# Patient Record
Sex: Male | Born: 1960 | Race: White | Hispanic: No | Marital: Married | Smoking: Current every day smoker
Health system: Southern US, Community
[De-identification: ages and names within clinical notes are randomized; demographics above are authoritative.]

## PROBLEM LIST (undated history)

## (undated) DIAGNOSIS — G8929 Other chronic pain: Secondary | ICD-10-CM

## (undated) DIAGNOSIS — M549 Dorsalgia, unspecified: Secondary | ICD-10-CM

## (undated) DIAGNOSIS — M545 Low back pain, unspecified: Secondary | ICD-10-CM

## (undated) DIAGNOSIS — Z720 Tobacco use: Secondary | ICD-10-CM

## (undated) HISTORY — DX: Other chronic pain: G89.29

## (undated) HISTORY — DX: Low back pain, unspecified: M54.50

## (undated) HISTORY — DX: Tobacco use: Z72.0

## (undated) HISTORY — PX: HERNIA REPAIR: SHX51

---

## 1992-02-04 HISTORY — PX: BACK SURGERY: SHX140

## 2014-03-22 ENCOUNTER — Encounter (HOSPITAL_COMMUNITY): Payer: Self-pay | Admitting: *Deleted

## 2014-03-22 ENCOUNTER — Emergency Department (HOSPITAL_COMMUNITY)
Admission: EM | Admit: 2014-03-22 | Discharge: 2014-03-22 | Discharge status: Against Medical Advice | Payer: 59 | Attending: Emergency Medicine | Admitting: Emergency Medicine

## 2014-03-22 DIAGNOSIS — R109 Unspecified abdominal pain: Secondary | ICD-10-CM | POA: Insufficient documentation

## 2014-03-22 DIAGNOSIS — G8929 Other chronic pain: Secondary | ICD-10-CM | POA: Diagnosis not present

## 2014-03-22 DIAGNOSIS — R1110 Vomiting, unspecified: Secondary | ICD-10-CM | POA: Diagnosis not present

## 2014-03-22 DIAGNOSIS — Z720 Tobacco use: Secondary | ICD-10-CM | POA: Insufficient documentation

## 2014-03-22 HISTORY — DX: Other chronic pain: G89.29

## 2014-03-22 HISTORY — DX: Dorsalgia, unspecified: M54.9

## 2014-03-22 LAB — COMPREHENSIVE METABOLIC PANEL
ALBUMIN: 4.2 g/dL (ref 3.5–5.2)
ALK PHOS: 60 U/L (ref 39–117)
ALT: 23 U/L (ref 0–53)
ANION GAP: 7 (ref 5–15)
AST: 19 U/L (ref 0–37)
BUN: 8 mg/dL (ref 6–23)
CHLORIDE: 106 mmol/L (ref 96–112)
CO2: 27 mmol/L (ref 19–32)
CREATININE: 0.77 mg/dL (ref 0.50–1.35)
Calcium: 9.4 mg/dL (ref 8.4–10.5)
GFR calc Af Amer: >90 mL/min (ref >90)
GLUCOSE: 100 mg/dL — AB (ref 70–99)
Potassium: 4.1 mmol/L (ref 3.5–5.1)
Sodium: 140 mmol/L (ref 135–145)
Total Bilirubin: 0.9 mg/dL (ref 0.3–1.2)
Total Protein: 6.9 g/dL (ref 6.0–8.3)

## 2014-03-22 LAB — CBC WITH DIFFERENTIAL/PLATELET
BASOS ABS: 0.1 10*3/uL (ref 0.0–0.1)
Basophils Relative: 1 % (ref 0–1)
EOS ABS: 0.2 10*3/uL (ref 0.0–0.7)
Eosinophils Relative: 4 % (ref 0–5)
HCT: 43.8 % (ref 39.0–52.0)
Hemoglobin: 14.9 g/dL (ref 13.0–17.0)
LYMPHS ABS: 1.7 10*3/uL (ref 0.7–4.0)
LYMPHS PCT: 27 % (ref 12–46)
MCH: 31 pg (ref 26.0–34.0)
MCHC: 34 g/dL (ref 30.0–36.0)
MCV: 91.3 fL (ref 78.0–100.0)
MONO ABS: 0.5 10*3/uL (ref 0.1–1.0)
Monocytes Relative: 8 % (ref 3–12)
Neutro Abs: 3.8 10*3/uL (ref 1.7–7.7)
Neutrophils Relative %: 60 % (ref 43–77)
PLATELETS: 302 10*3/uL (ref 150–400)
RBC: 4.8 MIL/uL (ref 4.22–5.81)
RDW: 13 % (ref 11.5–15.5)
WBC: 6.2 10*3/uL (ref 4.0–10.5)

## 2014-03-22 LAB — LIPASE, BLOOD: LIPASE: 21 U/L (ref 11–59)

## 2014-03-22 NOTE — ED Notes (Signed)
Pt did not answer x 1 

## 2014-03-22 NOTE — ED Notes (Signed)
Pt did not answer x 2 

## 2014-03-22 NOTE — ED Notes (Addendum)
Pt reports having abd discomfort and n/v x 3 weeks, went to [facility] and was told possibly gallbladder and given zofran and referrred to surgeon. Pt having increase in abd pain last night.

## 2016-08-15 ENCOUNTER — Encounter (HOSPITAL_BASED_OUTPATIENT_CLINIC_OR_DEPARTMENT_OTHER): Payer: Self-pay | Admitting: *Deleted

## 2016-08-15 ENCOUNTER — Emergency Department (HOSPITAL_BASED_OUTPATIENT_CLINIC_OR_DEPARTMENT_OTHER): Payer: Worker's Compensation

## 2016-08-15 ENCOUNTER — Emergency Department (HOSPITAL_BASED_OUTPATIENT_CLINIC_OR_DEPARTMENT_OTHER)
Admission: EM | Admit: 2016-08-15 | Discharge: 2016-08-15 | Disposition: A | Discharge status: Home / Self Care | Payer: Worker's Compensation | Attending: Physician Assistant | Admitting: Physician Assistant

## 2016-08-15 DIAGNOSIS — W19XXXA Unspecified fall, initial encounter: Secondary | ICD-10-CM | POA: Insufficient documentation

## 2016-08-15 DIAGNOSIS — F17210 Nicotine dependence, cigarettes, uncomplicated: Secondary | ICD-10-CM | POA: Insufficient documentation

## 2016-08-15 DIAGNOSIS — Z23 Encounter for immunization: Secondary | ICD-10-CM | POA: Diagnosis not present

## 2016-08-15 DIAGNOSIS — S59901A Unspecified injury of right elbow, initial encounter: Secondary | ICD-10-CM | POA: Diagnosis present

## 2016-08-15 DIAGNOSIS — S51011A Laceration without foreign body of right elbow, initial encounter: Secondary | ICD-10-CM | POA: Diagnosis not present

## 2016-08-15 DIAGNOSIS — Y990 Civilian activity done for income or pay: Secondary | ICD-10-CM | POA: Diagnosis not present

## 2016-08-15 DIAGNOSIS — S51019A Laceration without foreign body of unspecified elbow, initial encounter: Secondary | ICD-10-CM

## 2016-08-15 DIAGNOSIS — Y939 Activity, unspecified: Secondary | ICD-10-CM | POA: Insufficient documentation

## 2016-08-15 DIAGNOSIS — Y929 Unspecified place or not applicable: Secondary | ICD-10-CM | POA: Diagnosis not present

## 2016-08-15 IMAGING — DX DG ELBOW COMPLETE 3+V*R*
4 series · 4 of 4 positions shown · non-contrast
Comparison: None.

CLINICAL DATA: Slipped and fell yesterday. Right elbow pain and
swelling.

EXAM:
RIGHT ELBOW - COMPLETE 3+ VIEW

[elbow ap]
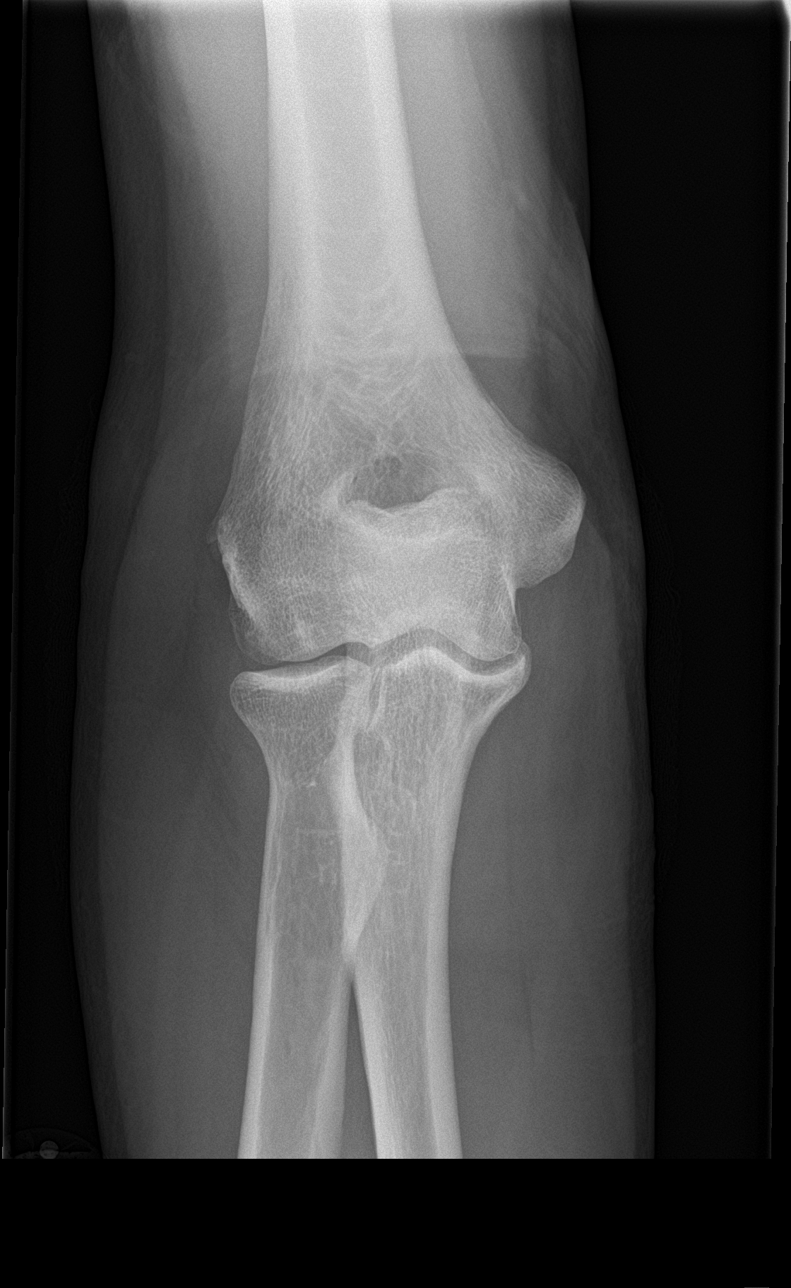

[elbow obl (1 of 2)]
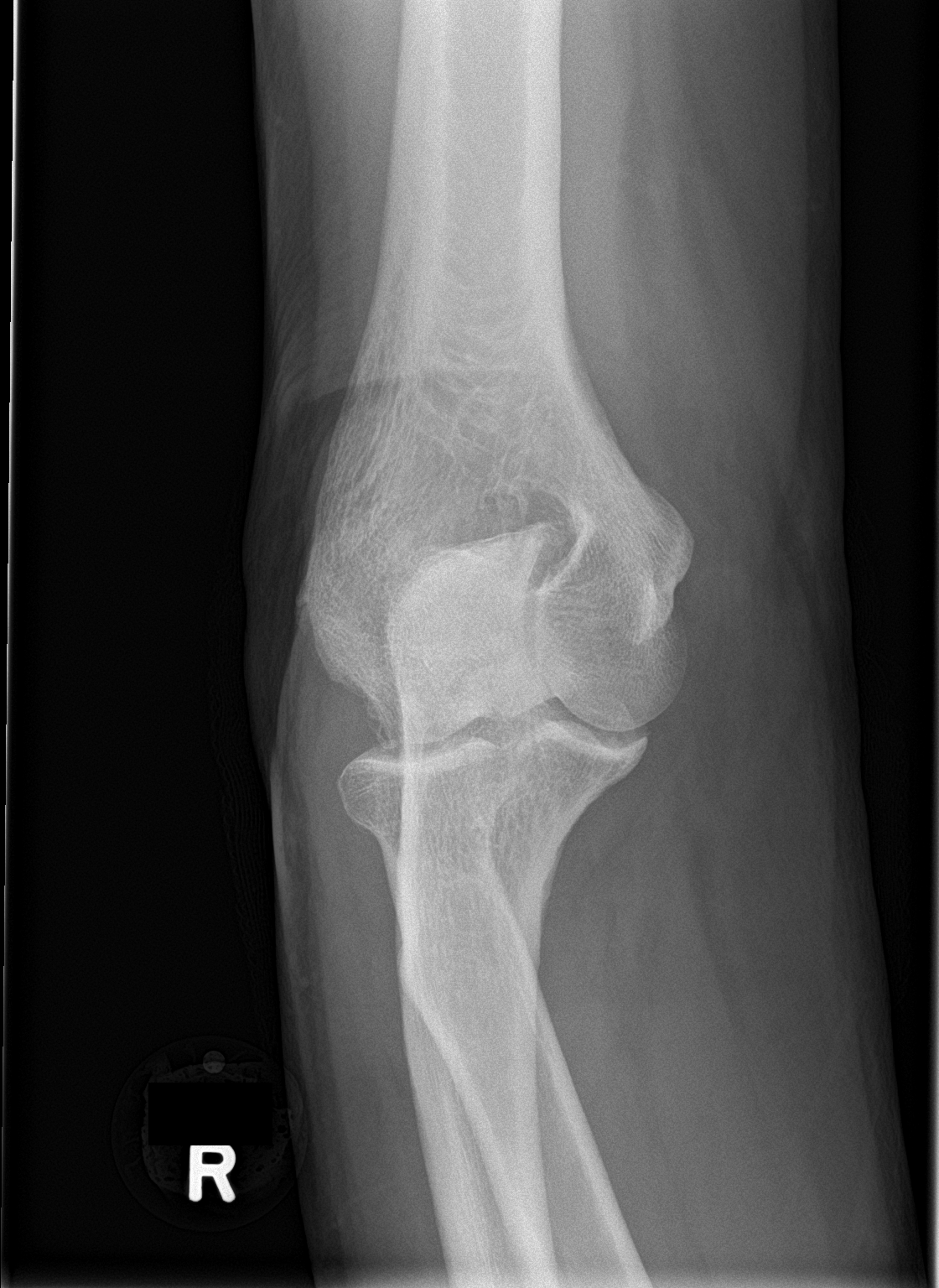

[elbow obl (2 of 2)]
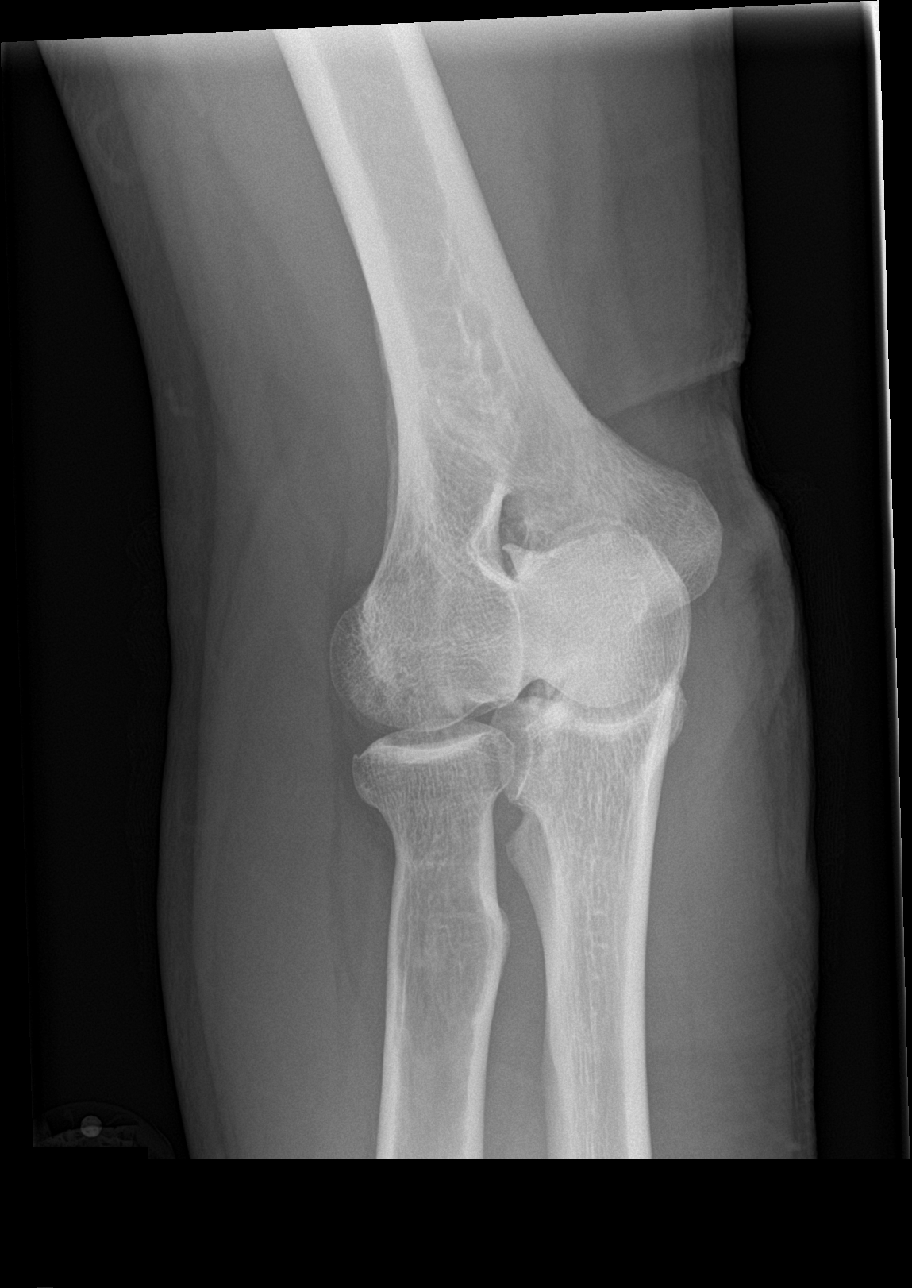

[elbow lat]
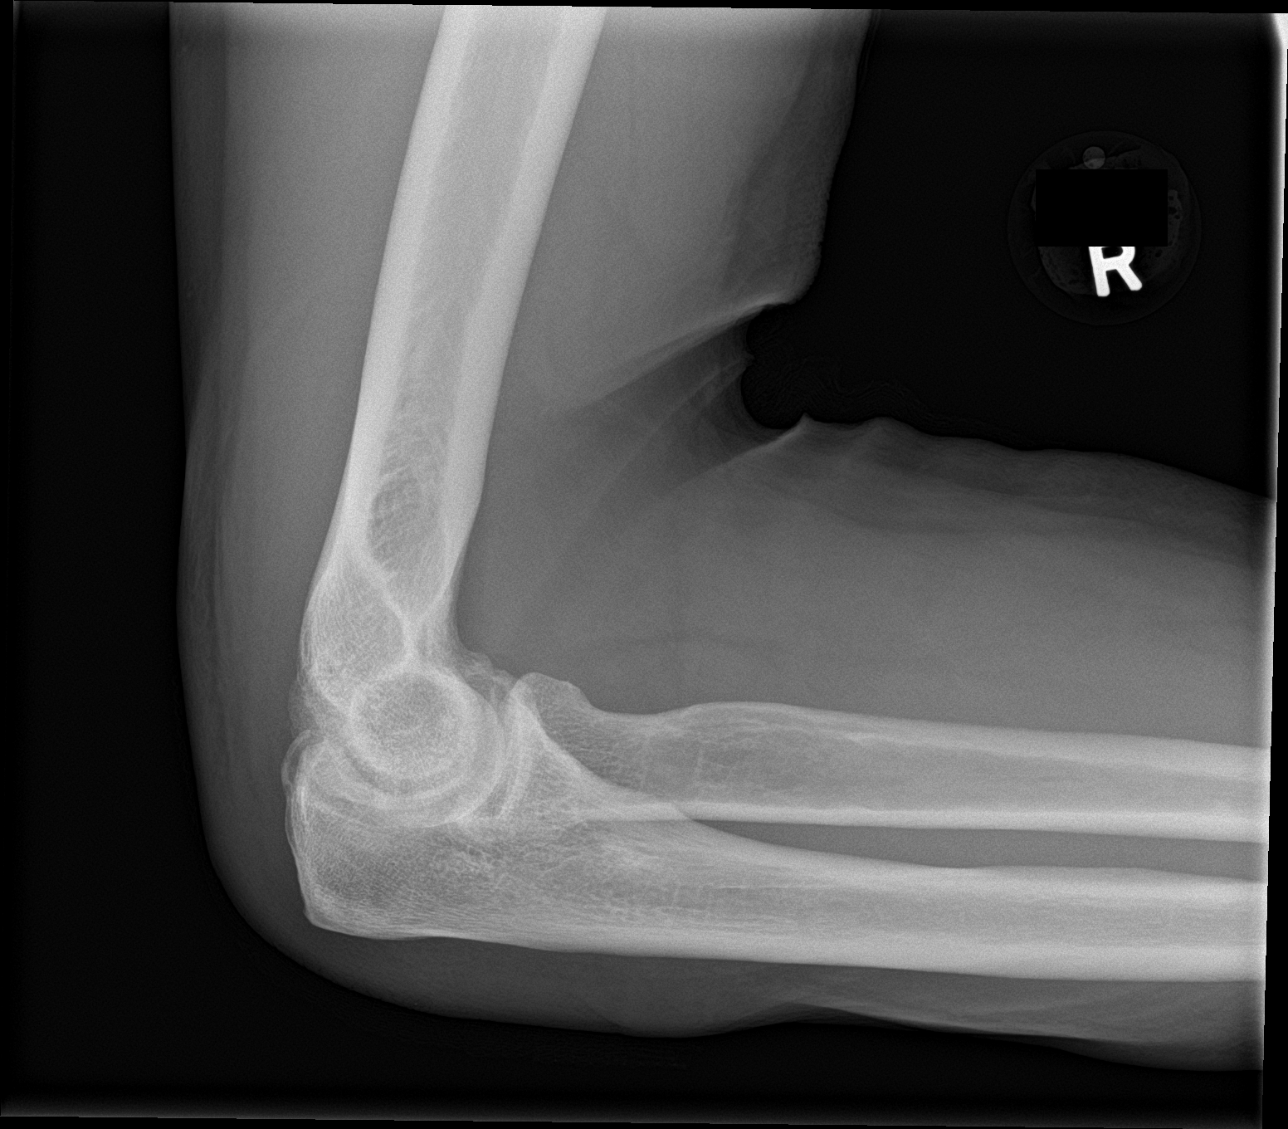

[4 of 4 positions shown; findings below may reference images not displayed]

FINDINGS: Extensive soft tissue swelling is present about the elbow. The elbow
is located. There is no significant joint effusion. No underlying
fracture is present.
IMPRESSION: Soft tissue swelling about the dorsal aspect of the elbow without
underlying fracture or dislocation.

## 2016-08-15 MED ORDER — LIDOCAINE HCL (PF) 1 % IJ SOLN
10.0000 mL | Freq: Once | INTRAMUSCULAR | Status: AC
  Administered 2016-08-15: 10 mL via INTRADERMAL
  Filled 2016-08-15: qty 10

## 2016-08-15 MED ORDER — CEFAZOLIN SODIUM-DEXTROSE 2-4 GM/100ML-% IV SOLN
2.0000 g | Freq: Once | INTRAVENOUS | Status: DC
Start: 2016-08-15 — End: 2016-08-15
  Filled 2016-08-15: qty 100

## 2016-08-15 MED ORDER — CEFAZOLIN SODIUM 1 G IJ SOLR
INTRAMUSCULAR | Status: AC
  Administered 2016-08-15: 2000 mg
  Filled 2016-08-15: qty 20

## 2016-08-15 MED ORDER — CEPHALEXIN 500 MG PO CAPS: ORAL_CAPSULE | ORAL | 0 refills | Status: DC

## 2016-08-15 MED ORDER — TETANUS-DIPHTH-ACELL PERTUSSIS 5-2.5-18.5 LF-MCG/0.5 IM SUSP
0.5000 mL | Freq: Once | INTRAMUSCULAR | Status: AC
  Administered 2016-08-15: 0.5 mL via INTRAMUSCULAR
  Filled 2016-08-15: qty 0.5

## 2016-08-15 NOTE — ED Notes (Signed)
Unsuccessful IV attempt x1 -- blood return noted, infiltrated with saline flush.

## 2016-08-15 NOTE — ED Triage Notes (Signed)
Right elbow laceration. He fell at work. Needs UDS for workmans comp.

## 2016-08-15 NOTE — ED Notes (Signed)
Post accident UDS completed.

## 2016-08-15 NOTE — ED Provider Notes (Signed)
[facility] Provider Note   CSN: [id number] Arrival date & time: 08/15/16  1652  By signing my name below, I, Theresia Bough, attest that this documentation has been prepared under the direction and in the presence of Margarita Mail, PA-C.  Electronically Signed: Theresia Bough, ED Scribe. 08/15/16. 6:08 PM.  History   Chief Complaint Chief Complaint  Patient presents with  . Extremity Laceration   The history is provided by the patient. No language interpreter was used.   HPI Comments: Jonathon Dixon is a 56 y.o. male who presents to the Emergency Department complaining of sudden onset laceration to the right elbow onset yesterday afternoon. Pt states he was at work and was hit with duct work causing him to fall and land on his right arm. Pt reports pain to the area. Tetanus is not UTD. No other complaints at this time.  Past Medical History:  Diagnosis Date  . Chronic back pain     There are no active problems to display for this patient.   History reviewed. No pertinent surgical history.     Home Medications    Prior to Admission medications   Medication Sig Start Date End Date Taking? Authorizing Provider  OXYCODONE HCL PO Take by mouth.   Yes [provider]    Family History No family history on file.  Social History Social History  Substance Use Topics  . Smoking status: Current Every Day Smoker    Types: Cigarettes  . Smokeless tobacco: Never Used  . Alcohol use No     Allergies   Patient has no known allergies.   Review of Systems Review of Systems All other systems reviewed and are negative for acute change except as noted in the HPI.  Physical Exam Updated Vital Signs BP 112/76   Pulse 68   Temp 98.3 F (36.8 C) (Oral)   Resp 20   Ht 6\' 2"  (1.88 m)   Wt 220 lb (99.8 kg)   SpO2 97%   BMI 28.25 kg/m   Physical Exam  Constitutional: He is oriented to person, place, and time. He appears well-developed and  well-nourished.  HENT:  Head: Normocephalic and atraumatic.  Eyes: EOM are normal.  Neck: Normal range of motion.  Cardiovascular: Normal rate, regular rhythm, normal heart sounds and intact distal pulses.   Pulmonary/Chest: Effort normal and breath sounds normal. No respiratory distress.  Abdominal: Soft. He exhibits no distension. There is no tenderness.  Musculoskeletal: Normal range of motion. He exhibits tenderness. He exhibits no edema or deformity.  Neurological: He is alert and oriented to person, place, and time.  Skin: Skin is warm and dry.  3 cm laceration over the distal forearm on the ulnar surface. Normal hand,wrist and shoulder exam on the right. No bony tenderness on the condyles. No bont tenderness on the olecranon. Tenderness over the laceration. Normal ROM and strength. Wound appears to be down to the joint capsule, bleeding is controlled.   Psychiatric: He has a normal mood and affect. Judgment normal.  Nursing note and vitals reviewed.    ED Treatments / Results  DIAGNOSTIC STUDIES: Oxygen Saturation is 97% on RA, normal by my interpretation.   COORDINATION OF CARE: 6:08 PM-Discussed next steps with pt including tetanus booster. Pt verbalized understanding and is agreeable with the plan.   Labs (all labs ordered are listed, but only abnormal results are displayed) Labs Reviewed - No data to display  EKG  EKG Interpretation None  Radiology Dg Elbow Complete Right  Result Date: 08/15/2016 CLINICAL DATA:  Slipped and fell yesterday. Right elbow pain and swelling. EXAM: RIGHT ELBOW - COMPLETE 3+ VIEW COMPARISON:  None. FINDINGS: Extensive soft tissue swelling is present about the elbow. The elbow is located. There is no significant joint effusion. No underlying fracture is present. IMPRESSION: Soft tissue swelling about the dorsal aspect of the elbow without underlying fracture or dislocation. Electronically Signed   By: San Morelle M.D.   On:  08/15/2016 17:30    Procedures Procedures (including critical care time)  Medications Ordered in ED Medications  lidocaine (PF) (XYLOCAINE) 1 % injection 10 mL (10 mLs Intradermal Given 08/15/16 1727)  Tdap (BOOSTRIX) injection 0.5 mL (0.5 mLs Intramuscular Given 08/15/16 1726)     Initial Impression / Assessment and Plan / ED Course  I have reviewed the triage vital signs and the nursing notes.  Pertinent labs & imaging results that were available during my care of the patient were reviewed by me and considered in my medical decision making (see chart for details).     Patient with tissue avulsion and laceration. Unable to close to 2 length of time since the initial injury. Patient treated with IV Ancef, seen in shared visit with attending physician. He was irrigated thoroughly in the emergency department. He appears safe for discharge at this time. No evidence of joint infection, full range of motion of the elbow. Follow-up with ortho  Final Clinical Impressions(s) / ED Diagnoses   Final diagnoses:  Laceration of elbow with complication, initial encounter    New Prescriptions New Prescriptions   No medications on file   I personally performed the services described in this documentation, which was scribed in my presence. The recorded information has been reviewed and is accurate.      Margarita Mail, PA-C 08/20/16 1533    Mackuen, Fredia Sorrow, MD 08/21/16 [phone]

## 2016-08-15 NOTE — Discharge Instructions (Signed)
Please take you antibiotics AS DIRECTED! Make sure to keep your sound covered and clean at all times.Gently cleanse the wound 2x daily  and change your dressing. You may apply antibiotic ointment.  Follow up with the orthopedist for any concerns Return to the Emergency department for any Worsening symptoms in the joint, fever, chills, or flu like symptoms, worsening redness, heat, swelling, or discharge from the wound.

## 2016-08-15 NOTE — ED Notes (Signed)
ED Provider at bedside. 

## 2017-02-13 ENCOUNTER — Emergency Department (HOSPITAL_BASED_OUTPATIENT_CLINIC_OR_DEPARTMENT_OTHER): Payer: 59

## 2017-02-13 ENCOUNTER — Emergency Department (HOSPITAL_BASED_OUTPATIENT_CLINIC_OR_DEPARTMENT_OTHER)
Admission: EM | Admit: 2017-02-13 | Discharge: 2017-02-13 | Disposition: A | Discharge status: Home / Self Care | Payer: 59 | Attending: Emergency Medicine | Admitting: Emergency Medicine

## 2017-02-13 ENCOUNTER — Other Ambulatory Visit: Payer: Self-pay

## 2017-02-13 ENCOUNTER — Encounter (HOSPITAL_BASED_OUTPATIENT_CLINIC_OR_DEPARTMENT_OTHER): Payer: Self-pay | Admitting: Emergency Medicine

## 2017-02-13 DIAGNOSIS — Y939 Activity, unspecified: Secondary | ICD-10-CM | POA: Diagnosis not present

## 2017-02-13 DIAGNOSIS — M7021 Olecranon bursitis, right elbow: Secondary | ICD-10-CM | POA: Diagnosis not present

## 2017-02-13 DIAGNOSIS — F17210 Nicotine dependence, cigarettes, uncomplicated: Secondary | ICD-10-CM | POA: Diagnosis not present

## 2017-02-13 DIAGNOSIS — R2231 Localized swelling, mass and lump, right upper limb: Secondary | ICD-10-CM | POA: Diagnosis present

## 2017-02-13 NOTE — ED Notes (Signed)
Family at bedside. 

## 2017-02-13 NOTE — ED Triage Notes (Signed)
Patient states that he fell and hurt his right elbow about 8 months ago. It was doing better until about 3 weeks ago and he started to get swelling to the area after another injury

## 2017-02-13 NOTE — ED Provider Notes (Signed)
[facility] EMERGENCY DEPARTMENT Provider Note   CSN: [id number] Arrival date & time: 02/13/17  1541     History   Chief Complaint Chief Complaint  Patient presents with  . Joint Swelling    HPI Jonathon Dixon is a 57 y.o. male.  HPI Patient presents with right elbow pain and swelling.  Had previous injury around 8 months ago and had been doing better but around 3 weeks ago began to have more swelling.  No fevers.  No real pain with movement.  States he is hoping that he can get drained and something injected.  No numbness weakness.  No new severe trauma. Past Medical History:  Diagnosis Date  . Chronic back pain     There are no active problems to display for this patient.   History reviewed. No pertinent surgical history.     Home Medications    Prior to Admission medications   Medication Sig Start Date End Date Taking? Authorizing Provider  cephALEXin (KEFLEX) 500 MG capsule 2 caps po bid x 7 days 08/15/16   Margarita Mail, PA-C  OXYCODONE HCL PO Take by mouth.    [provider]    Family History History reviewed. No pertinent family history.  Social History Social History   Tobacco Use  . Smoking status: Current Every Day Smoker    Types: Cigarettes  . Smokeless tobacco: Never Used  Substance Use Topics  . Alcohol use: No  . Drug use: No     Allergies   Patient has no known allergies.   Review of Systems Review of Systems  Constitutional: Negative for chills and fever.  Respiratory: Negative for shortness of breath.   Musculoskeletal: Negative for neck pain.       Right elbow swelling.  Skin: Negative for rash and wound.  Neurological: Negative for weakness and numbness.     Physical Exam Updated Vital Signs BP (!) 143/92 (BP Location: Right Arm)   Pulse 66   Temp 98.2 F (36.8 C) (Oral)   Resp 18   Ht 6\' 1"  (1.854 m)   Wt 99.8 kg (220 lb)   SpO2 100%   BMI 29.03 kg/m   Physical Exam  Constitutional: He  appears well-developed.  HENT:  Head: Normocephalic.  Cardiovascular: Normal rate.  Musculoskeletal: He exhibits no tenderness.  Olecranon bursitis right elbow.  Fluctuant swollen area over olecranon.  No erythema.  No tenderness.  Painless range of motion in the elbow.  Neurovascular intact in right wrist and hand.  Neurological: He is alert.  Skin: Skin is warm. Capillary refill takes less than 2 seconds.     ED Treatments / Results  Labs (all labs ordered are listed, but only abnormal results are displayed) Labs Reviewed - No data to display  EKG  EKG Interpretation None       Radiology Dg Elbow Complete Right  Result Date: 02/13/2017 CLINICAL DATA:  Trauma to the posterior right elbow 2 weeks ago with swelling. EXAM: RIGHT ELBOW - COMPLETE 3+ VIEW COMPARISON:  August 15, 2016 FINDINGS: There is no evidence of fracture, dislocation, or joint effusion. There is no evidence of arthropathy or other focal bone abnormality. There is marked soft tissue swelling of the posterior elbow. IMPRESSION: No acute fracture or dislocation. Marked soft tissue swelling of posterior elbow. Electronically Signed   By: Abelardo Diesel M.D.   On: 02/13/2017 16:30    Procedures Procedures (including critical care time)  Medications Ordered in ED Medications - No  data to display   Initial Impression / Assessment and Plan / ED Course  I have reviewed the triage vital signs and the nursing notes.  Pertinent labs & imaging results that were available during my care of the patient were reviewed by me and considered in my medical decision making (see chart for details).     Patient with apparent olecranon bursitis.  Has no signs of infection.  Given sports medicine name for follow-up for potential drainage and steroid injection.  Patient states he will just follow-up with his primary care doctor for cannot get done today.  Final Clinical Impressions(s) / ED Diagnoses   Final diagnoses:  Olecranon  bursitis of right elbow    ED Discharge Orders    None       Davonna Belling, MD 02/13/17 [phone]

## 2017-02-13 NOTE — ED Notes (Signed)
ED Provider at bedside. 

## 2019-11-28 ENCOUNTER — Encounter: Payer: Self-pay | Admitting: Plastic Surgery

## 2019-11-28 ENCOUNTER — Other Ambulatory Visit: Payer: Self-pay

## 2019-11-28 ENCOUNTER — Ambulatory Visit (INDEPENDENT_AMBULATORY_CARE_PROVIDER_SITE_OTHER): Payer: 59 | Admitting: Plastic Surgery

## 2019-11-28 VITALS — BP 133/80 | HR 88 | Temp 97.9°F | Ht 74.0 in | Wt 215.6 lb

## 2019-11-28 DIAGNOSIS — C4402 Squamous cell carcinoma of skin of lip: Secondary | ICD-10-CM | POA: Diagnosis not present

## 2019-11-28 NOTE — Progress Notes (Signed)
   Referring Provider Blythe Stanford., MD [address] [place],  [place] [address]   CC:  Chief Complaint  Patient presents with  . Advice Only      Jonathon Dixon is an 59 y.o. male.  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  This was biopsied externally but also extends onto the oromucosa inside the mouth.  He believes it is gotten smaller over the last couple weeks but is certainly not gone away.  He would like to have it removed.  No Known Allergies  Outpatient Encounter Medications as of 11/28/2019  Medication Sig  . OXYCODONE HCL PO Take 10 mg by mouth 4 (four) times daily.   . [DISCONTINUED] cephALEXin (KEFLEX) 500 MG capsule 2 caps po bid x 7 days   No facility-administered encounter medications on file as of 11/28/2019.     Past Medical History:  Diagnosis Date  . Chronic back pain     No past surgical history on file.  No family history on file.  Social History   Social History Narrative  . Not on file     Review of Systems General: Denies fevers, chills, weight loss CV: Denies chest pain, shortness of breath, palpitations  Physical Exam Vitals with BMI 11/28/2019 02/13/2017 02/13/2017  Height 6\' 2"  - -  Weight 215 lbs 10 oz - -  BMI 62.94 - -  Systolic [phone]  Diastolic 80 92 76  Pulse 88 66 67    General:  No acute distress,  Alert and oriented, Non-Toxic, Normal speech and affect Examination shows a 1 x 3 cm lesion that starts at the right oral commissure and extends onto the oromucosa across the vermilion.  There is no submandibular cervical lymphadenopathy.  I do not see any obvious other surrounding skin lesions.  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  I recommended excision.  I did discuss doing this in the operating room under frozen sections to ensure appropriate margin control.  We discussed the risk  that include bleeding, infection, damage to surrounding structures and need for additional procedures.  All of his questions were answered and we will plan to move forward with scheduling.  Cindra Presume 11/28/2019, 1:56 PM

## 2019-12-23 ENCOUNTER — Ambulatory Visit (INDEPENDENT_AMBULATORY_CARE_PROVIDER_SITE_OTHER): Payer: 59 | Admitting: Surgical

## 2019-12-23 ENCOUNTER — Encounter: Payer: Self-pay | Admitting: Surgical

## 2019-12-23 ENCOUNTER — Other Ambulatory Visit: Payer: Self-pay | Admitting: Surgical

## 2019-12-23 ENCOUNTER — Other Ambulatory Visit: Payer: Self-pay

## 2019-12-23 VITALS — BP 153/89 | HR 63 | Temp 97.5°F | Ht 73.0 in | Wt 218.8 lb

## 2019-12-23 DIAGNOSIS — C4402 Squamous cell carcinoma of skin of lip: Secondary | ICD-10-CM

## 2019-12-23 MED ORDER — ONDANSETRON HCL 4 MG PO TABS: 4.0000 mg | ORAL_TABLET | Freq: Three times a day (TID) | ORAL | 0 refills | Status: DC | PRN

## 2019-12-23 NOTE — Progress Notes (Signed)
Patient ID: Jonathon Dixon, male    DOB: [birth year], 59 y.o.   MRN: [id number]  Chief Complaint  Patient presents with  . Pre-op Exam      ICD-10-CM   1. Squamous cell carcinoma, lip  C44.02     History of Present Illness: Jonathon Dixon is a 59 y.o.  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 Claudia Desanctis from Mohs surgeon.  He presents for preoperative evaluation for upcoming procedure, excision of lower lip squamous cell carcinoma with complex closure and frozen sections, scheduled for 01/09/2020 with Dr. Claudia Desanctis  The patient has not had problems with anesthesia. No history of DVT/PE.  No family history of DVT/PE.  No family or personal history of bleeding or clotting disorders.  Patient is not currently taking any blood thinners.  No history of CVA/MI.   Summary of Previous Visit: Patient presented as a referral from his Mohs surgery for evaluation of squamous of carcinoma of his right lower lip.  The area was biopsied externally but extends onto oral mucosa of the mouth.  PMH Significant for: Chronic with history of multiple spinal surgeries, currently taking oxycodone 10 rehab today.  Has a pain contract with his wife.  Discussed with him that we may monitor his old scar from postop  Patient is a current everyday smoker, 1 pack/day. Patient reports no history of cardiac or pulmonary complications.  Reports no history of strokes or heart attack.  He does not take any prescribed medications other than oxycodone.  Patient reports he has held off on paraspinal pain, no recent fevers or chills, nausea or vomiting.  He is able to walk up a hill or stairs without chest pain.  He has not had any chest pain at rest.  No recent changes in his health.  He reports that he has not seen his PCP in a few years.   Past Medical History: Allergies: No Known Allergies  Current Medications:  Current Outpatient Medications:  .  OXYCODONE HCL PO, Take 10 mg by  mouth 4 (four) times daily. , Disp: , Rfl:   Past Medical Problems: Past Medical History:  Diagnosis Date  . Chronic back pain     Past Surgical History: History reviewed. No pertinent surgical history.  Social History: Social History   Socioeconomic History  . Marital status: Married    Spouse name: Not on file  . Number of children: Not on file  . Years of education: Not on file  . Highest education level: Not on file  Occupational History  . Not on file  Tobacco Use  . Smoking status: Current Every Day Smoker    Types: Cigarettes  . Smokeless tobacco: Never Used  Substance and Sexual Activity  . Alcohol use: No  . Drug use: No  . Sexual activity: Not on file  Other Topics Concern  . Not on file  Social History Narrative  . Not on file   Social Determinants of Health   Financial Resource Strain:   . Difficulty of Paying Living Expenses: Not on file  Food Insecurity:   . Worried About Charity fundraiser in the Last Year: Not on file  . Ran Out of Food in the Last Year: Not on file  Transportation Needs:   . Lack of Transportation (Medical): Not on file  . Lack of Transportation (Non-Medical): Not on file  Physical Activity:   . Days of Exercise  per Week: Not on file  . Minutes of Exercise per Session: Not on file  Stress:   . Feeling of Stress : Not on file  Social Connections:   . Frequency of Communication with Friends and Family: Not on file  . Frequency of Social Gatherings with Friends and Family: Not on file  . Attends Religious Services: Not on file  . Active Member of Clubs or Organizations: Not on file  . Attends Archivist Meetings: Not on file  . Marital Status: Not on file  Intimate Partner Violence:   . Fear of Current or Ex-Partner: Not on file  . Emotionally Abused: Not on file  . Physically Abused: Not on file  . Sexually Abused: Not on file    Family History: History reviewed. No pertinent family history.  Review of  Systems: Review of Systems  Constitutional: Negative.   Respiratory: Negative.   Cardiovascular: Negative.   Gastrointestinal: Negative.   Musculoskeletal: Positive for back pain.    Physical Exam: Vital Signs BP (!) 153/89 (BP Location: Left Arm, Patient Position: Sitting, Cuff Size: Large)   Pulse 63   Temp (!) 97.5 F (36.4 C) (Oral)   Ht 6\' 1"  (1.854 m)   Wt 218 lb 12.8 oz (99.2 kg)   SpO2 98%   BMI 28.87 kg/m   Physical Exam Constitutional:      General: He is not in acute distress.    Appearance: Normal appearance. He is not ill-appearing.  HENT:     Head: Normocephalic and atraumatic.  Cardiovascular:     Rate and Rhythm: Normal rate and regular rhythm.     Pulses: Normal pulses.     Heart sounds: Normal heart sounds.  Pulmonary:     Effort: Pulmonary effort is normal.     Breath sounds: Normal breath sounds.  Musculoskeletal:        General: No swelling or tenderness. Normal range of motion.     Cervical back: Normal range of motion.     Right lower leg: No edema.     Left lower leg: No edema.  Skin:    General: Skin is warm and dry.  Neurological:     General: No focal deficit present.     Mental Status: He is alert and oriented to person, place, and time.  Psychiatric:        Mood and Affect: Mood normal.        Behavior: Behavior normal.    Mouth: Lesion noted along the right oral commissure that extends into the oral mucosa   Assessment/Plan: The patient is scheduled for excision of lower lip squamous cell carcinoma with complex closure and frozen sections with Dr. Claudia Desanctis.  Risks, benefits, and alternatives of procedure discussed, questions answered and consent obtained.    Smoking Status: 1 pack/day; Counseling Given?  Discussed the risks associated with smoking and the increased risks of poor wound healing and postoperative complications.  Patient agreed and understood.  He reports that he will likely not quit smoking, but will try.  Patient  reports no history of cardiac or pulmonary disease.   Caprini Score: 5, high; Risk Factors include: Age, BMI greater than 25, current smoker recommendation for mechanical and pharmacological prophylaxis. Encourage early ambulation.   Pictures obtained: Today  Post-op Rx sent to pharmacy: Zofran, patient takes oxycodone 10 3 times daily prescribed by pain management.  Patient was provided with the General Surgical Risk consent document and Pain Medication Agreement prior to their appointment.  They had adequate time to read through the risk consent documents and Pain Medication Agreement. We also discussed them in person together during this preop appointment. All of their questions were answered to their satisfaction.  Recommended calling if they have any further questions.  Risk consent form and Pain Medication Agreement to be scanned into patient's chart.  We discussed the risks associated with the specific surgical intervention, we discussed postoperative expectations.  We also discussed that the extent of the excision will not be known until frozen sections have been reviewed by pathology intraoperatively.    Pictures were obtained of the patient and placed in the chart with the patient's or guardian's permission.   Electronically signed by: Carola Rhine Janin Kozlowski, PA-C 12/23/2019 11:38 AM

## 2019-12-23 NOTE — H&P (View-Only) (Signed)
Patient ID: Jonathon Dixon, male    DOB: March 20, 1960, 59 y.o.   MRN: [id number]  Chief Complaint  Patient presents with  . Pre-op Exam      ICD-10-CM   1. Squamous cell carcinoma, lip  C44.02     History of Present Illness: Jonathon Dixon is a 59 y.o.  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 Claudia Desanctis from Mohs surgeon.  He presents for preoperative evaluation for upcoming procedure, excision of lower lip squamous cell carcinoma with complex closure and frozen sections, scheduled for 01/09/2020 with Dr. Claudia Desanctis  The patient has not had problems with anesthesia. No history of DVT/PE.  No family history of DVT/PE.  No family or personal history of bleeding or clotting disorders.  Patient is not currently taking any blood thinners.  No history of CVA/MI.   Summary of Previous Visit: Patient presented as a referral from his Mohs surgery for evaluation of squamous of carcinoma of his right lower lip.  The area was biopsied externally but extends onto oral mucosa of the mouth.  PMH Significant for: Chronic with history of multiple spinal surgeries, currently taking oxycodone 10 rehab today.  Has a pain contract with his wife.  Discussed with him that we may monitor his old scar from postop  Patient is a current everyday smoker, 1 pack/day. Patient reports no history of cardiac or pulmonary complications.  Reports no history of strokes or heart attack.  He does not take any prescribed medications other than oxycodone.  Patient reports he has held off on paraspinal pain, no recent fevers or chills, nausea or vomiting.  He is able to walk up a hill or stairs without chest pain.  He has not had any chest pain at rest.  No recent changes in his health.  He reports that he has not seen his PCP in a few years.   Past Medical History: Allergies: No Known Allergies  Current Medications:  Current Outpatient Medications:  .  OXYCODONE HCL PO, Take 10 mg by  mouth 4 (four) times daily. , Disp: , Rfl:   Past Medical Problems: Past Medical History:  Diagnosis Date  . Chronic back pain     Past Surgical History: History reviewed. No pertinent surgical history.  Social History: Social History   Socioeconomic History  . Marital status: Married    Spouse name: Not on file  . Number of children: Not on file  . Years of education: Not on file  . Highest education level: Not on file  Occupational History  . Not on file  Tobacco Use  . Smoking status: Current Every Day Smoker    Types: Cigarettes  . Smokeless tobacco: Never Used  Substance and Sexual Activity  . Alcohol use: No  . Drug use: No  . Sexual activity: Not on file  Other Topics Concern  . Not on file  Social History Narrative  . Not on file   Social Determinants of Health   Financial Resource Strain:   . Difficulty of Paying Living Expenses: Not on file  Food Insecurity:   . Worried About Charity fundraiser in the Last Year: Not on file  . Ran Out of Food in the Last Year: Not on file  Transportation Needs:   . Lack of Transportation (Medical): Not on file  . Lack of Transportation (Non-Medical): Not on file  Physical Activity:   . Days of Exercise  per Week: Not on file  . Minutes of Exercise per Session: Not on file  Stress:   . Feeling of Stress : Not on file  Social Connections:   . Frequency of Communication with Friends and Family: Not on file  . Frequency of Social Gatherings with Friends and Family: Not on file  . Attends Religious Services: Not on file  . Active Member of Clubs or Organizations: Not on file  . Attends Archivist Meetings: Not on file  . Marital Status: Not on file  Intimate Partner Violence:   . Fear of Current or Ex-Partner: Not on file  . Emotionally Abused: Not on file  . Physically Abused: Not on file  . Sexually Abused: Not on file    Family History: History reviewed. No pertinent family history.  Review of  Systems: Review of Systems  Constitutional: Negative.   Respiratory: Negative.   Cardiovascular: Negative.   Gastrointestinal: Negative.   Musculoskeletal: Positive for back pain.    Physical Exam: Vital Signs BP (!) 153/89 (BP Location: Left Arm, Patient Position: Sitting, Cuff Size: Large)   Pulse 63   Temp (!) 97.5 F (36.4 C) (Oral)   Ht 6\' 1"  (1.854 m)   Wt 218 lb 12.8 oz (99.2 kg)   SpO2 98%   BMI 28.87 kg/m   Physical Exam Constitutional:      General: He is not in acute distress.    Appearance: Normal appearance. He is not ill-appearing.  HENT:     Head: Normocephalic and atraumatic.  Cardiovascular:     Rate and Rhythm: Normal rate and regular rhythm.     Pulses: Normal pulses.     Heart sounds: Normal heart sounds.  Pulmonary:     Effort: Pulmonary effort is normal.     Breath sounds: Normal breath sounds.  Musculoskeletal:        General: No swelling or tenderness. Normal range of motion.     Cervical back: Normal range of motion.     Right lower leg: No edema.     Left lower leg: No edema.  Skin:    General: Skin is warm and dry.  Neurological:     General: No focal deficit present.     Mental Status: He is alert and oriented to person, place, and time.  Psychiatric:        Mood and Affect: Mood normal.        Behavior: Behavior normal.    Mouth: Lesion noted along the right oral commissure that extends into the oral mucosa   Assessment/Plan: The patient is scheduled for excision of lower lip squamous cell carcinoma with complex closure and frozen sections with Dr. Claudia Desanctis.  Risks, benefits, and alternatives of procedure discussed, questions answered and consent obtained.    Smoking Status: 1 pack/day; Counseling Given?  Discussed the risks associated with smoking and the increased risks of poor wound healing and postoperative complications.  Patient agreed and understood.  He reports that he will likely not quit smoking, but will try.  Patient  reports no history of cardiac or pulmonary disease.   Caprini Score: 5, high; Risk Factors include: Age, BMI greater than 25, current smoker recommendation for mechanical and pharmacological prophylaxis. Encourage early ambulation.   Pictures obtained: Today  Post-op Rx sent to pharmacy: Zofran, patient takes oxycodone 10 3 times daily prescribed by pain management.  Patient was provided with the General Surgical Risk consent document and Pain Medication Agreement prior to their appointment.  They had adequate time to read through the risk consent documents and Pain Medication Agreement. We also discussed them in person together during this preop appointment. All of their questions were answered to their satisfaction.  Recommended calling if they have any further questions.  Risk consent form and Pain Medication Agreement to be scanned into patient's chart.  We discussed the risks associated with the specific surgical intervention, we discussed postoperative expectations.  We also discussed that the extent of the excision will not be known until frozen sections have been reviewed by pathology intraoperatively.    Pictures were obtained of the patient and placed in the chart with the patient's or guardian's permission.   Electronically signed by: Carola Rhine Tiaunna Buford, PA-C 12/23/2019 11:38 AM

## 2019-12-28 ENCOUNTER — Encounter (HOSPITAL_BASED_OUTPATIENT_CLINIC_OR_DEPARTMENT_OTHER): Payer: Self-pay | Admitting: Plastic Surgery

## 2019-12-28 ENCOUNTER — Other Ambulatory Visit: Payer: Self-pay

## 2020-01-06 ENCOUNTER — Other Ambulatory Visit (HOSPITAL_COMMUNITY)
Admission: RE | Admit: 2020-01-06 | Discharge: 2020-01-06 | Disposition: A | Discharge status: Home / Self Care | Payer: 59 | Source: Ambulatory Visit | Attending: Plastic Surgery | Admitting: Plastic Surgery

## 2020-01-06 DIAGNOSIS — Z20822 Contact with and (suspected) exposure to covid-19: Secondary | ICD-10-CM | POA: Insufficient documentation

## 2020-01-06 DIAGNOSIS — Z01812 Encounter for preprocedural laboratory examination: Secondary | ICD-10-CM | POA: Insufficient documentation

## 2020-01-06 LAB — SARS CORONAVIRUS 2 (TAT 6-24 HRS): SARS Coronavirus 2: NEGATIVE

## 2020-01-09 ENCOUNTER — Encounter (HOSPITAL_BASED_OUTPATIENT_CLINIC_OR_DEPARTMENT_OTHER)
Admission: RE | Disposition: A | Discharge status: Home / Self Care | Payer: Self-pay | Source: Home / Self Care | Attending: Plastic Surgery

## 2020-01-09 ENCOUNTER — Ambulatory Visit (HOSPITAL_BASED_OUTPATIENT_CLINIC_OR_DEPARTMENT_OTHER): Payer: 59 | Admitting: Certified Registered"

## 2020-01-09 ENCOUNTER — Other Ambulatory Visit: Payer: Self-pay

## 2020-01-09 ENCOUNTER — Ambulatory Visit (HOSPITAL_BASED_OUTPATIENT_CLINIC_OR_DEPARTMENT_OTHER)
Admission: RE | Admit: 2020-01-09 | Discharge: 2020-01-09 | Disposition: A | Discharge status: Home / Self Care | Payer: 59 | Attending: Plastic Surgery | Admitting: Plastic Surgery

## 2020-01-09 DIAGNOSIS — C006 Malignant neoplasm of commissure of lip, unspecified: Secondary | ICD-10-CM | POA: Insufficient documentation

## 2020-01-09 DIAGNOSIS — F17210 Nicotine dependence, cigarettes, uncomplicated: Secondary | ICD-10-CM | POA: Diagnosis not present

## 2020-01-09 DIAGNOSIS — C4402 Squamous cell carcinoma of skin of lip: Secondary | ICD-10-CM

## 2020-01-09 HISTORY — PX: BASAL CELL CARCINOMA EXCISION: SHX1214

## 2020-01-09 SURGERY — EXCISION, CARCINOMA, BASAL CELL, WITH FROZEN SECTION EXAMINATION
Anesthesia: General | Site: Mouth | Laterality: Right

## 2020-01-09 MED ORDER — ONDANSETRON HCL 4 MG/2ML IJ SOLN
INTRAMUSCULAR | Status: DC | PRN
  Administered 2020-01-09: 4 mg via INTRAVENOUS

## 2020-01-09 MED ORDER — OXYCODONE HCL 5 MG PO TABS: 5.0000 mg | ORAL_TABLET | Freq: Once | ORAL | Status: DC | PRN

## 2020-01-09 MED ORDER — DEXAMETHASONE SODIUM PHOSPHATE 10 MG/ML IJ SOLN
INTRAMUSCULAR | Status: DC | PRN
  Administered 2020-01-09: 10 mg via INTRAVENOUS

## 2020-01-09 MED ORDER — BACITRACIN 500 UNIT/GM EX OINT
TOPICAL_OINTMENT | CUTANEOUS | Status: DC | PRN
  Administered 2020-01-09: 1 via TOPICAL

## 2020-01-09 MED ORDER — CHLORHEXIDINE GLUCONATE CLOTH 2 % EX PADS: 6.0000 | MEDICATED_PAD | Freq: Once | CUTANEOUS | Status: DC

## 2020-01-09 MED ORDER — MIDAZOLAM HCL 2 MG/2ML IJ SOLN
INTRAMUSCULAR | Status: AC
  Filled 2020-01-09: qty 2

## 2020-01-09 MED ORDER — OXYCODONE HCL 5 MG/5ML PO SOLN: 5.0000 mg | Freq: Once | ORAL | Status: DC | PRN

## 2020-01-09 MED ORDER — CEFAZOLIN SODIUM-DEXTROSE 2-4 GM/100ML-% IV SOLN: 2.0000 g | INTRAVENOUS | Status: DC

## 2020-01-09 MED ORDER — CEFAZOLIN SODIUM-DEXTROSE 2-4 GM/100ML-% IV SOLN
2.0000 g | INTRAVENOUS | Status: AC
  Administered 2020-01-09: 2 g via INTRAVENOUS

## 2020-01-09 MED ORDER — CEFAZOLIN SODIUM-DEXTROSE 2-4 GM/100ML-% IV SOLN
INTRAVENOUS | Status: AC
  Filled 2020-01-09: qty 100

## 2020-01-09 MED ORDER — PHENYLEPHRINE 40 MCG/ML (10ML) SYRINGE FOR IV PUSH (FOR BLOOD PRESSURE SUPPORT)
PREFILLED_SYRINGE | INTRAVENOUS | Status: AC
  Filled 2020-01-09: qty 10

## 2020-01-09 MED ORDER — MIDAZOLAM HCL 5 MG/5ML IJ SOLN
INTRAMUSCULAR | Status: DC | PRN
  Administered 2020-01-09: 2 mg via INTRAVENOUS

## 2020-01-09 MED ORDER — LIDOCAINE-EPINEPHRINE 1 %-1:100000 IJ SOLN
INTRAMUSCULAR | Status: DC | PRN
  Administered 2020-01-09: 10 mL

## 2020-01-09 MED ORDER — LIDOCAINE 2% (20 MG/ML) 5 ML SYRINGE
INTRAMUSCULAR | Status: DC | PRN
  Administered 2020-01-09: 60 mg via INTRAVENOUS

## 2020-01-09 MED ORDER — FENTANYL CITRATE (PF) 100 MCG/2ML IJ SOLN
INTRAMUSCULAR | Status: AC
  Filled 2020-01-09: qty 2

## 2020-01-09 MED ORDER — SUGAMMADEX SODIUM 200 MG/2ML IV SOLN
INTRAVENOUS | Status: DC | PRN
  Administered 2020-01-09: 200 mg via INTRAVENOUS

## 2020-01-09 MED ORDER — BACITRACIN ZINC 500 UNIT/GM EX OINT
TOPICAL_OINTMENT | CUTANEOUS | Status: AC
  Filled 2020-01-09: qty 0.9

## 2020-01-09 MED ORDER — LIDOCAINE-EPINEPHRINE 1 %-1:100000 IJ SOLN
INTRAMUSCULAR | Status: AC
  Filled 2020-01-09: qty 1

## 2020-01-09 MED ORDER — ROCURONIUM BROMIDE 10 MG/ML (PF) SYRINGE
PREFILLED_SYRINGE | INTRAVENOUS | Status: AC
  Filled 2020-01-09: qty 10

## 2020-01-09 MED ORDER — LACTATED RINGERS IV SOLN: INTRAVENOUS | Status: DC

## 2020-01-09 MED ORDER — ONDANSETRON HCL 4 MG/2ML IJ SOLN
INTRAMUSCULAR | Status: AC
  Filled 2020-01-09: qty 2

## 2020-01-09 MED ORDER — LIDOCAINE 2% (20 MG/ML) 5 ML SYRINGE
INTRAMUSCULAR | Status: AC
  Filled 2020-01-09: qty 5

## 2020-01-09 MED ORDER — FENTANYL CITRATE (PF) 100 MCG/2ML IJ SOLN: 25.0000 ug | INTRAMUSCULAR | Status: DC | PRN

## 2020-01-09 MED ORDER — ROCURONIUM BROMIDE 100 MG/10ML IV SOLN
INTRAVENOUS | Status: DC | PRN
  Administered 2020-01-09: 40 mg via INTRAVENOUS

## 2020-01-09 MED ORDER — PROPOFOL 10 MG/ML IV BOLUS
INTRAVENOUS | Status: DC | PRN
  Administered 2020-01-09: 200 mg via INTRAVENOUS
  Administered 2020-01-09: 50 mg via INTRAVENOUS

## 2020-01-09 MED ORDER — ONDANSETRON HCL 4 MG/2ML IJ SOLN: 4.0000 mg | Freq: Once | INTRAMUSCULAR | Status: DC | PRN

## 2020-01-09 MED ORDER — EPHEDRINE 5 MG/ML INJ
INTRAVENOUS | Status: AC
  Filled 2020-01-09: qty 10

## 2020-01-09 MED ORDER — AMISULPRIDE (ANTIEMETIC) 5 MG/2ML IV SOLN: 10.0000 mg | Freq: Once | INTRAVENOUS | Status: DC | PRN

## 2020-01-09 MED ORDER — DEXAMETHASONE SODIUM PHOSPHATE 10 MG/ML IJ SOLN
INTRAMUSCULAR | Status: AC
  Filled 2020-01-09: qty 1

## 2020-01-09 MED ORDER — FENTANYL CITRATE (PF) 100 MCG/2ML IJ SOLN
INTRAMUSCULAR | Status: DC | PRN
  Administered 2020-01-09 (×2): 50 ug via INTRAVENOUS

## 2020-01-09 MED ORDER — EPHEDRINE SULFATE 50 MG/ML IJ SOLN
INTRAMUSCULAR | Status: DC | PRN
  Administered 2020-01-09: 15 mg via INTRAVENOUS

## 2020-01-09 SURGICAL SUPPLY — 82 items
ADH SKN CLS APL DERMABOND .7 (GAUZE/BANDAGES/DRESSINGS)
APL PRP STRL LF DISP 70% ISPRP (MISCELLANEOUS)
APL SKNCLS STERI-STRIP NONHPOA (GAUZE/BANDAGES/DRESSINGS)
BAND INSRT 18 STRL LF DISP RB (MISCELLANEOUS)
BAND RUBBER #18 3X1/16 STRL (MISCELLANEOUS) IMPLANT
BENZOIN TINCTURE PRP APPL 2/3 (GAUZE/BANDAGES/DRESSINGS) IMPLANT
BLADE 11 SAFETY STRL DISP (BLADE) ×3 IMPLANT
BLADE CLIPPER SURG (BLADE) ×3 IMPLANT
BLADE SURG 15 STRL LF DISP TIS (BLADE) ×1 IMPLANT
BLADE SURG 15 STRL SS (BLADE) ×3
CANISTER SUCT 1200ML W/VALVE (MISCELLANEOUS) IMPLANT
CHLORAPREP W/TINT 26 (MISCELLANEOUS) IMPLANT
CLOSURE WOUND 1/2 X4 (GAUZE/BANDAGES/DRESSINGS)
COVER BACK TABLE 60X90IN (DRAPES) ×3 IMPLANT
COVER MAYO STAND STRL (DRAPES) ×3 IMPLANT
COVER WAND RF STERILE (DRAPES) IMPLANT
DERMABOND ADVANCED (GAUZE/BANDAGES/DRESSINGS)
DERMABOND ADVANCED .7 DNX12 (GAUZE/BANDAGES/DRESSINGS) IMPLANT
DRAIN JP 10F RND SILICONE (MISCELLANEOUS) IMPLANT
DRAPE LAPAROTOMY 100X72 PEDS (DRAPES) IMPLANT
DRAPE U-SHAPE 76X120 STRL (DRAPES) ×3 IMPLANT
DRAPE UTILITY XL STRL (DRAPES) ×3 IMPLANT
DRSG TELFA 3X8 NADH (GAUZE/BANDAGES/DRESSINGS) IMPLANT
ELECT COATED BLADE 2.86 ST (ELECTRODE) IMPLANT
ELECT NEEDLE BLADE 2-5/6 (NEEDLE) IMPLANT
ELECT REM PT RETURN 9FT ADLT (ELECTROSURGICAL) ×3
ELECT REM PT RETURN 9FT PED (ELECTROSURGICAL)
ELECTRODE REM PT RETRN 9FT PED (ELECTROSURGICAL) IMPLANT
ELECTRODE REM PT RTRN 9FT ADLT (ELECTROSURGICAL) ×1 IMPLANT
EVACUATOR SILICONE 100CC (DRAIN) IMPLANT
GAUZE SPONGE 4X4 12PLY STRL LF (GAUZE/BANDAGES/DRESSINGS) IMPLANT
GAUZE XEROFORM 1X8 LF (GAUZE/BANDAGES/DRESSINGS) IMPLANT
GLOVE BIOGEL M STRL SZ7.5 (GLOVE) ×3 IMPLANT
GLOVE BIOGEL PI IND STRL 6.5 (GLOVE) ×1 IMPLANT
GLOVE BIOGEL PI IND STRL 7.0 (GLOVE) ×1 IMPLANT
GLOVE BIOGEL PI INDICATOR 6.5 (GLOVE) ×2
GLOVE BIOGEL PI INDICATOR 7.0 (GLOVE) ×2
GLOVE ECLIPSE 6.5 STRL STRAW (GLOVE) ×3 IMPLANT
GLOVE SRG 8 PF TXTR STRL LF DI (GLOVE) ×1 IMPLANT
GLOVE SURG UNDER POLY LF SZ8 (GLOVE) ×3
GOWN STRL REUS W/ TWL LRG LVL3 (GOWN DISPOSABLE) ×1 IMPLANT
GOWN STRL REUS W/TWL LRG LVL3 (GOWN DISPOSABLE) ×3
MARKER SKIN DUAL TIP RULER LAB (MISCELLANEOUS) ×3 IMPLANT
NDL SAFETY ECLIPSE 18X1.5 (NEEDLE) IMPLANT
NEEDLE FILTER BLUNT 18X 1/2SAF (NEEDLE)
NEEDLE FILTER BLUNT 18X1 1/2 (NEEDLE) IMPLANT
NEEDLE HYPO 18GX1.5 SHARP (NEEDLE)
NEEDLE HYPO 30GX1 BEV (NEEDLE) IMPLANT
NEEDLE PRECISIONGLIDE 27X1.5 (NEEDLE) ×3 IMPLANT
NS IRRIG 1000ML POUR BTL (IV SOLUTION) ×3 IMPLANT
PACK BASIN DAY SURGERY FS (CUSTOM PROCEDURE TRAY) ×3 IMPLANT
PENCIL SMOKE EVACUATOR (MISCELLANEOUS) ×3 IMPLANT
SHEET MEDIUM DRAPE 40X70 STRL (DRAPES) ×3 IMPLANT
SLEEVE SCD COMPRESS KNEE MED (MISCELLANEOUS) ×3 IMPLANT
SPONGE GAUZE 2X2 8PLY STER LF (GAUZE/BANDAGES/DRESSINGS)
SPONGE GAUZE 2X2 8PLY STRL LF (GAUZE/BANDAGES/DRESSINGS) IMPLANT
SPONGE LAP 18X18 RF (DISPOSABLE) ×3 IMPLANT
STAPLER INSORB 30 2030 C-SECTI (MISCELLANEOUS) IMPLANT
STAPLER VISISTAT 35W (STAPLE) ×3 IMPLANT
STRIP CLOSURE SKIN 1/2X4 (GAUZE/BANDAGES/DRESSINGS) IMPLANT
SUCTION FRAZIER HANDLE 10FR (MISCELLANEOUS)
SUCTION TUBE FRAZIER 10FR DISP (MISCELLANEOUS) IMPLANT
SUT ETHILON 4 0 PS 2 18 (SUTURE) IMPLANT
SUT MNCRL AB 4-0 PS2 18 (SUTURE) IMPLANT
SUT MON AB 5-0 P3 18 (SUTURE) ×3 IMPLANT
SUT PDS 3-0 CT2 (SUTURE)
SUT PDS II 3-0 CT2 27 ABS (SUTURE) IMPLANT
SUT PLAIN 5 0 P 3 18 (SUTURE) ×3 IMPLANT
SUT PROLENE 5 0 P 3 (SUTURE) IMPLANT
SUT SILK 4 0 P 3 (SUTURE) ×3 IMPLANT
SUT VICRYL 4-0 PS2 18IN ABS (SUTURE) ×6 IMPLANT
SUT VLOC 90 P-14 23 (SUTURE) IMPLANT
SWAB COLLECTION DEVICE MRSA (MISCELLANEOUS) IMPLANT
SWAB CULTURE ESWAB REG 1ML (MISCELLANEOUS) IMPLANT
SYR 50ML LL SCALE MARK (SYRINGE) IMPLANT
SYR BULB EAR ULCER 3OZ GRN STR (SYRINGE) IMPLANT
SYR CONTROL 10ML LL (SYRINGE) ×6 IMPLANT
TOWEL GREEN STERILE FF (TOWEL DISPOSABLE) ×3 IMPLANT
TRAY DSU PREP LF (CUSTOM PROCEDURE TRAY) ×3 IMPLANT
TUBE CONNECTING 20'X1/4 (TUBING)
TUBE CONNECTING 20X1/4 (TUBING) IMPLANT
TUBING INFILTRATION IT-10001 (TUBING) IMPLANT

## 2020-01-09 NOTE — Anesthesia Preprocedure Evaluation (Addendum)
Anesthesia Evaluation  Patient identified by MRN, date of birth, ID band Patient awake    Reviewed: Allergy & Precautions, NPO status , Patient's Chart, lab work & pertinent test results  History of Anesthesia Complications Negative for: history of anesthetic complications  Airway Mallampati: II  TM Distance: >3 FB Neck ROM: Full    Dental  (+) Edentulous Upper, Partial Lower   Pulmonary Current Smoker,    Pulmonary exam normal        Cardiovascular negative cardio ROS Normal cardiovascular exam     Neuro/Psych negative neurological ROS  negative psych ROS   GI/Hepatic negative GI ROS, Neg liver ROS,   Endo/Other  negative endocrine ROS  Renal/GU negative Renal ROS  negative genitourinary   Musculoskeletal  (+) narcotic dependent  Abdominal   Peds  Hematology negative hematology ROS (+)   Anesthesia Other Findings   Reproductive/Obstetrics                          Anesthesia Physical Anesthesia Plan  ASA: II  Anesthesia Plan: General   Post-op Pain Management:    Induction: Intravenous  PONV Risk Score and Plan: Ondansetron, Dexamethasone, Treatment may vary due to age or medical condition and Midazolam  Airway Management Planned: Oral ETT  Additional Equipment: None  Intra-op Plan:   Post-operative Plan: Extubation in OR  Informed Consent: I have reviewed the patients History and Physical, chart, labs and discussed the procedure including the risks, benefits and alternatives for the proposed anesthesia with the patient or authorized representative who has indicated his/her understanding and acceptance.     Dental advisory given  Plan Discussed with:   Anesthesia Plan Comments:        Anesthesia Quick Evaluation

## 2020-01-09 NOTE — Transfer of Care (Signed)
Immediate Anesthesia Transfer of Care Note  Patient: Jonathon Dixon  Procedure(s) Performed: Excision of lower lip squamous cell carcinoma with complex closure and frozen sections (Right Mouth)  Patient Location: PACU  Anesthesia Type:General  Level of Consciousness: drowsy  Airway & Oxygen Therapy: Patient Spontanous Breathing and Patient connected to face mask oxygen  Post-op Assessment: Report given to RN and Post -op Vital signs reviewed and stable  Post vital signs: Reviewed and stable  Last Vitals:  Vitals Value Taken Time  BP 126/82 01/09/20 1115  Temp    Pulse 92 01/09/20 1117  Resp 19 01/09/20 1117  SpO2 99 % 01/09/20 1117  Vitals shown include unvalidated device data.  Last Pain:  Vitals:   01/09/20 0859  TempSrc: Oral  PainSc: 5       Patients Stated Pain Goal: 3 (97/94/80 1655)  Complications: No complications documented.

## 2020-01-09 NOTE — Discharge Instructions (Signed)
Activity: As tolerated, but avoid strenuous activity until follow up visit.  Diet: Regular  Wound Care: May shower tomorrow.  Redress the wound as needed for comfort.  Special Instructions:  Call our office if any unusual problems occur such as pain, excessive bleeding, unrelieved nausea/vomiting, fever &/or chills.  Follow-up appointment: Scheduled for next week.   Post Anesthesia Home Care Instructions  Activity: Get plenty of rest for the remainder of the day. A responsible individual must stay with you for 24 hours following the procedure.  For the next 24 hours, DO NOT: -Drive a car -Paediatric nurse -Drink alcoholic beverages -Take any medication unless instructed by your physician -Make any legal decisions or sign important papers.  Meals: Start with liquid foods such as gelatin or soup. Progress to regular foods as tolerated. Avoid greasy, spicy, heavy foods. If nausea and/or vomiting occur, drink only clear liquids until the nausea and/or vomiting subsides. Call your physician if vomiting continues.  Special Instructions/Symptoms: Your throat may feel dry or sore from the anesthesia or the breathing tube placed in your throat during surgery. If this causes discomfort, gargle with warm salt water. The discomfort should disappear within 24 hours.  If you had a scopolamine patch placed behind your ear for the management of post- operative nausea and/or vomiting:  1. The medication in the patch is effective for 72 hours, after which it should be removed.  Wrap patch in a tissue and discard in the trash. Wash hands thoroughly with soap and water. 2. You may remove the patch earlier than 72 hours if you experience unpleasant side effects which may include dry mouth, dizziness or visual disturbances. 3. Avoid touching the patch. Wash your hands with soap and water after contact with the patch.

## 2020-01-09 NOTE — Op Note (Signed)
Operative Note   DATE OF OPERATION: 01/09/2020  SURGICAL DEPARTMENT: Plastic Surgery  PREOPERATIVE DIAGNOSES: Right lower lip squamous cell carcinoma  POSTOPERATIVE DIAGNOSES:  same  PROCEDURE: 1.  Excision of right lip squamous cell carcinoma with frozen section 2.  Right lip complex closure totaling 6 cm in length  SURGEON: Talmadge Coventry, MD  ASSISTANT: Idelia Salm, PA The advanced practice practitioner (APP) assisted throughout the case.  The APP was essential in retraction and counter traction when needed to make the case progress smoothly.  This retraction and assistance made it possible to see the tissue plans for the procedure.  The assistance was needed for blood control, tissue re-approximation and assisted with closure of the incision site.  ANESTHESIA:  General.   COMPLICATIONS: None.   INDICATIONS FOR PROCEDURE:  The patient, Jonathon Dixon is a 59 y.o. male born on Feb 15, 1960, is here for treatment of squamous cell carcinoma of the right oral commissure MRN: [id number]  CONSENT:  Informed consent was obtained directly from the patient. Risks, benefits and alternatives were fully discussed. Specific risks including but not limited to bleeding, infection, hematoma, seroma, scarring, pain, contracture, asymmetry, wound healing problems, and need for further surgery were all discussed. The patient did have an ample opportunity to have questions answered to satisfaction.   DESCRIPTION OF PROCEDURE:  The patient was taken to the operating room. SCDs were placed and antibiotics were given.  General anesthesia was administered.  The patient's operative site was prepped and draped in a sterile fashion. A time out was performed and all information was confirmed to be correct.  Started by examining the lesion.  This was a ulcerative lesion focused at the right oral commissure.  It extended across the vermilion onto the skin and also intraorally.  I marked out 6 to 8 mm of margin  with the skin on stretch.  Local anesthesia was then administered and this was given time to work.  An 11 blade was used to excise this full-thickness.  A suture was used to mark the superior margin and this was sent to pathology for frozen section.  After frozen section analysis no suspicious cancer or tumor was seen at the margins so I elected to proceed with closure.  Meticulous hemostasis was obtained.  4-0 Vicryl was used to reapproximate the muscle.  4-0 Vicryl was used to close the mucosal side of the lip and also across the vermilion.  The skin portion was closed with interrupted buried 5-0 Monocryl sutures and running 5-0 plain gut.  Bacitracin was applied.  Total length of closure with is about 6 cm in length.  The patient tolerated the procedure well.  There were no complications. The patient was allowed to wake from anesthesia, extubated and taken to the recovery room in satisfactory condition.

## 2020-01-09 NOTE — Anesthesia Procedure Notes (Signed)
Procedure Name: Intubation Date/Time: 01/09/2020 10:04 AM Performed by: Lavonia Dana, CRNA Pre-anesthesia Checklist: Patient identified, Emergency Drugs available, Suction available and Patient being monitored Patient Re-evaluated:Patient Re-evaluated prior to induction Oxygen Delivery Method: Circle system utilized Preoxygenation: Pre-oxygenation with 100% oxygen Induction Type: IV induction Ventilation: Mask ventilation without difficulty and Oral airway inserted - appropriate to patient size Laryngoscope Size: Mac and 4 Grade View: Grade I Tube type: Oral Tube size: 7.0 mm Number of attempts: 1 Airway Equipment and Method: Stylet,  Oral airway and Bite block Placement Confirmation: ETT inserted through vocal cords under direct vision,  positive ETCO2 and breath sounds checked- equal and bilateral Secured at: 24 cm Tube secured with: Tape Dental Injury: Teeth and Oropharynx as per pre-operative assessment

## 2020-01-09 NOTE — Anesthesia Postprocedure Evaluation (Signed)
Anesthesia Post Note  Patient: Jonathon Dixon  Procedure(s) Performed: Excision of lower lip squamous cell carcinoma with complex closure and frozen sections (Right Mouth)     Patient location during evaluation: PACU Anesthesia Type: General Level of consciousness: awake and alert Pain management: pain level controlled Vital Signs Assessment: post-procedure vital signs reviewed and stable Respiratory status: spontaneous breathing, nonlabored ventilation and respiratory function stable Cardiovascular status: blood pressure returned to baseline and stable Postop Assessment: no apparent nausea or vomiting Anesthetic complications: no   No complications documented.  Last Vitals:  Vitals:   01/09/20 1156 01/09/20 1215  BP:  120/85  Pulse: 79 71  Resp: 16 16  Temp:  36.5 C  SpO2: 93% 100%    Last Pain:  Vitals:   01/09/20 1215  TempSrc:   PainSc: 0-No pain                 Lidia Collum

## 2020-01-09 NOTE — Brief Op Note (Signed)
01/09/2020  11:13 AM  PATIENT:  Jonathon Dixon  59 y.o. male  PRE-OPERATIVE DIAGNOSIS:  squamous cell carcinoma of lip  POST-OPERATIVE DIAGNOSIS:  squamous cell carcinoma of lip  PROCEDURE:  Procedure(s): Excision of lower lip squamous cell carcinoma with complex closure and frozen sections (Right)  SURGEON:  Surgeon(s) and Role:    * Abcde Oneil, Steffanie Dunn, MD - Primary  PHYSICIAN ASSISTANT: Phoebe Sharps, PA  ASSISTANTS: none   ANESTHESIA:   general  EBL:  2 mL   BLOOD ADMINISTERED:none  DRAINS: none   LOCAL MEDICATIONS USED:  LIDOCAINE   SPECIMEN:  Source of Specimen:  right lip squamous cell carcinoma  DISPOSITION OF SPECIMEN:  PATHOLOGY  COUNTS:  YES  TOURNIQUET:  * No tourniquets in log *  DICTATION: .Dragon Dictation  PLAN OF CARE: Discharge to home after PACU  PATIENT DISPOSITION:  PACU - hemodynamically stable.   Delay start of Pharmacological VTE agent (>24hrs) due to surgical blood loss or risk of bleeding: not applicable

## 2020-01-09 NOTE — Interval H&P Note (Signed)
History and Physical Interval Note:  01/09/2020 9:36 AM  Jonathon Dixon  has presented today for surgery, with the diagnosis of squamous cell carcinoma of lip.  The various methods of treatment have been discussed with the patient and family. After consideration of risks, benefits and other options for treatment, the patient has consented to  Procedure(s) with comments: Excision of lower lip squamous cell carcinoma with complex closure and frozen sections (N/A) - 45 MIN as a surgical intervention.  The patient's history has been reviewed, patient examined, no change in status, stable for surgery.  I have reviewed the patient's chart and labs.  Questions were answered to the patient's satisfaction.     Cindra Presume

## 2020-01-09 NOTE — OR Nursing (Signed)
Pathology call to OR. Phone to Dr. Claudia Desanctis for results.

## 2020-01-10 ENCOUNTER — Encounter (HOSPITAL_BASED_OUTPATIENT_CLINIC_OR_DEPARTMENT_OTHER): Payer: Self-pay | Admitting: Plastic Surgery

## 2020-01-11 LAB — SURGICAL PATHOLOGY

## 2020-01-18 ENCOUNTER — Ambulatory Visit (INDEPENDENT_AMBULATORY_CARE_PROVIDER_SITE_OTHER): Payer: 59 | Admitting: Plastic Surgery

## 2020-01-18 ENCOUNTER — Encounter: Payer: Self-pay | Admitting: Plastic Surgery

## 2020-01-18 ENCOUNTER — Other Ambulatory Visit: Payer: Self-pay

## 2020-01-18 VITALS — BP 127/78 | HR 69 | Temp 97.7°F

## 2020-01-18 DIAGNOSIS — L989 Disorder of the skin and subcutaneous tissue, unspecified: Secondary | ICD-10-CM

## 2020-01-18 NOTE — Progress Notes (Signed)
Patient presents postop from excision of dysplastic hyperkeratosis in the right lower lip at the level of the commissure. Margins were negative for any dysplasia. He feels like things are healing fine and although he feels a little bit tight and opening his mouth it is not too bothersome for him. He is very happy with the appearance of things at this point in time. On exam everything looks to be healing fine with no signs of any problems. Given the option to follow-up on an as-needed basis and he prefer that. He will call us if he has any issues. All of his questions were answered.

## 2021-01-04 ENCOUNTER — Ambulatory Visit: Payer: 59 | Admitting: Cardiovascular Disease

## 2021-01-04 ENCOUNTER — Encounter: Payer: Self-pay | Admitting: Cardiovascular Disease

## 2021-01-04 ENCOUNTER — Other Ambulatory Visit: Payer: Self-pay

## 2021-01-04 VITALS — BP 112/72 | HR 71 | Ht 73.0 in | Wt 214.2 lb

## 2021-01-04 DIAGNOSIS — I200 Unstable angina: Secondary | ICD-10-CM | POA: Diagnosis not present

## 2021-01-04 DIAGNOSIS — R0609 Other forms of dyspnea: Secondary | ICD-10-CM

## 2021-01-04 DIAGNOSIS — E782 Mixed hyperlipidemia: Secondary | ICD-10-CM | POA: Diagnosis not present

## 2021-01-04 MED ORDER — METOPROLOL TARTRATE 100 MG PO TABS: 100.0000 mg | ORAL_TABLET | Freq: Two times a day (BID) | ORAL | 3 refills | Status: DC

## 2021-01-04 MED ORDER — METOPROLOL TARTRATE 100 MG PO TABS: ORAL_TABLET | ORAL | 0 refills | Status: DC

## 2021-01-04 NOTE — Patient Instructions (Addendum)
Medication Instructions:  Your physician recommends that you continue on your current medications as directed. Please refer to the Current Medication list given to you today.  *If you need a refill on your cardiac medications before your next appointment, please call your pharmacy*   Lab Work: TODAY: BMP IN 2 MONTHS: Fasting Lipid Panel , ALT  If you have labs (blood work) drawn today and your tests are completely normal, you will receive your results only by: MyChart Message (if you have MyChart) OR A paper copy in the mail If you have any lab test that is abnormal or we need to change your treatment, we will call you to review the results.   Testing/Procedures: Your physician has requested that you have an echocardiogram. Echocardiography is a painless test that uses sound waves to create images of your heart. It provides your doctor with information about the size and shape of your heart and how well your heart's chambers and valves are working. This procedure takes approximately one hour. There are no restrictions for this procedure.  Your physician has requested that you have cardiac CT. Cardiac computed tomography (CT) is a painless test that uses an x-ray machine to take clear, detailed pictures of your heart.  Please follow instruction sheet as given.     Follow-Up: At [facility], you and your health needs are our priority.  As part of our continuing mission to provide you with exceptional heart care, we have created designated Provider Care Teams.  These Care Teams include your primary Cardiologist (physician) and Advanced Practice Providers (APPs -  Physician Assistants and Nurse Practitioners) who all work together to provide you with the care you need, when you need it.   Your next appointment:   3 month(s)  The format for your next appointment:   In Person  Provider:  Mertie Moores, MD      Other Instructions    [facility] [address] [place], [place] [address] [phone]   At [facility], please arrive at the [facility] main entrance (entrance A) of [facility] 30 minutes prior to test start time. You can use the FREE valet parking offered at the main entrance (encouraged to control the heart rate for the test) Proceed to the [facility] Radiology Department (first floor) to check-in and test prep.   Please follow these instructions carefully (unless otherwise directed):  Hold all erectile dysfunction medications at least 3 days (72 hrs) prior to test.  On the Night Before the Test: Be sure to Drink plenty of water. Do not consume any caffeinated/decaffeinated beverages or chocolate 12 hours prior to your test. Do not take any antihistamines 12 hours prior to your test.   On the Day of the Test: Drink plenty of water until 1 hour prior to the test. Do not eat any food 4 hours prior to the test. You may take your regular medications prior to the test.  Take metoprolol (Lopressor) 100 mg two hours prior to test.        After the Test: Drink plenty of water. After receiving IV contrast, you may experience a mild flushed feeling. This is normal. On occasion, you may experience a mild rash up to 24 hours after the test. This is not dangerous. If this occurs, you can take Benadryl 25 mg and increase your fluid intake. If you experience trouble breathing, this can be serious. If it is severe call 911 IMMEDIATELY. If it is mild,  please call our office. If you take any of these medications: Glipizide/Metformin, Avandament, Glucavance, please do not take 48 hours after completing test unless otherwise instructed.  Please allow 2-4 weeks for scheduling of routine cardiac CTs. Some insurance companies require a pre-authorization which may delay scheduling of this test.   For non-scheduling related questions, please contact the cardiac imaging nurse navigator should you have any  questions/concerns: Marchia Bond, Cardiac Imaging Nurse Navigator Gordy Clement, Cardiac Imaging Nurse Navigator [facility] Direct Office Dial: [phone]   For scheduling needs, including cancellations and rescheduling, please call [place], [phone].

## 2021-01-04 NOTE — Progress Notes (Signed)
Cardiology Office Note:    Date:  01/04/2021   ID:  [place]athon [place], DOB 12-26-1960, MRN [id number]  PCP:  [place]athon [place]., MD   [place] Providers Cardiologist:  [place]    Referring MD: [place]., MD   Chief Complaint  Patient presents with   Hyperlipidemia    Dec. 2, 2022   Jonathon Dixon is a 60 y.o. male with a hx of hyperlipidemia and cigarette smoking.  We are asked to see him today by [place]athon [place] for further evaluation and management of his hyperlipidemia.   [place] is seen today for the first time.  Seen with [place]athon [place], daughter.   He brought some lab data with him including an LDL particle number of 2018.  His LDL cholesterol is 153.  Triglyceride levels 124.  Total cholesterol is 208.  His HDL is 20.5.  His insulin resistance score is 75 with a normal being less than 45.  Has DOE climbing stairs,  No regular exercise    He had a coronary calcium score of 1022 at [place] ( [facility] )   This is 95th percentile for age and gender matched controls.  He had three-vessel coronary artery calcifications.  Smokes > 1 ppd   Fam. Hx - father had MI, CABG, Brother had MI   He eats an unrestricted diet. He has a milkshake every night.  Has not had  covid .    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   HYDROcodone-acetaminophen (NORCO) 10-325 MG tablet Take 1 tablet by mouth every 6 (six) hours as needed.   ibuprofen (ADVIL) 400 MG tablet Take 400 mg by mouth every 6 (six) hours as needed.   naproxen sodium (ALEVE) 220 MG tablet Take 220 mg by mouth 2 (two) times daily.   OXYCODONE HCL PO Take 10 mg by mouth 4 (four) times daily.    SYMBICORT 160-4.5 MCG/ACT inhaler 2 puffs as needed.   traZODone (DESYREL) 150 MG tablet Take 0.5 mg by mouth as needed.   varenicline (CHANTIX PAK) 0.5 MG X 11 & 1 MG X 42 tablet Take by mouth as directed.   [DISCONTINUED] metoprolol tartrate (LOPRESSOR) 100 MG tablet Take 1 tablet (100 mg total) by mouth 2 (two) times daily.     Allergies:   [place] [rosuvastatin]   Social History   Socioeconomic History   Marital status: Married    Spouse name: Not on file   Number of children: Not on file   Years of education: Not on file   Highest education level: Not on file  Occupational History   Not on file  Tobacco Use   Smoking status: Every Day    Types: Cigarettes  Smokeless tobacco: Never  Substance and Sexual Activity   Alcohol use: No   Drug use: No   Sexual activity: Not on file  Other Topics Concern   Not on file  Social History Narrative   Not on file   Social Determinants of Health   Financial Resource Strain: Not on file  Food Insecurity: Not on file  Transportation Needs: Not on file  Physical Activity: Not on file  Stress: Not on file  Social Connections: Not on file     Family History: The patient's family history includes Heart attack in his brother and father.  ROS:   Please see the history of present illness.     All other systems reviewed and are negative.  EKGs/Labs/Other Studies Reviewed:    The following studies were reviewed today:   EKG:   December 2, normal sinus rhythm at 71.  No ST or T wave changes.  Recent Labs: No results found for requested labs within last 8760 hours.  Recent Lipid Panel No  results found for: CHOL, TRIG, HDL, CHOLHDL, VLDL, LDLCALC, LDLDIRECT   Risk Assessment/Calculations:           Physical Exam:    VS:  BP 112/72   Pulse 71   Ht 6\' 1"  (1.854 m)   Wt 214 lb 3.2 oz (97.2 kg)   SpO2 98%   BMI 28.26 kg/m     Wt Readings from Last 3 Encounters:  01/04/21 214 lb 3.2 oz (97.2 kg)  01/09/20 212 lb 1.3 oz (96.2 kg)  12/23/19 218 lb 12.8 oz (99.2 kg)     GEN:  Well nourished, well developed in no acute distress HEENT: Normal NECK: No JVD; No carotid bruits LYMPHATICS: No lymphadenopathy CARDIAC: RRR, no murmurs, rubs, gallops RESPIRATORY:  Clear to auscultation without rales, wheezing or rhonchi  ABDOMEN: Soft, non-tender, non-distended MUSCULOSKELETAL:  No edema; No deformity  SKIN: Warm and dry NEUROLOGIC:  Alert and oriented x 3 PSYCHIATRIC:  Normal affect   ASSESSMENT:    1. DOE (dyspnea on exertion)   2. Unstable angina ([place])   3. Mixed hyperlipidemia    PLAN:    In order of problems listed above:  Possible unstable angina: [place] is seen today for progressive shortness of breath with exertion.  Recently his been having more more shortness of breath with less and less exertion.  He has a very strong family history of coronary artery disease.  He has a very high lipids.  He has a long history of cigarette smoking.  Coronary calcium score is 1022 which is 95 percentile for age and gender matched controls.  Given his symptoms of progressive shortness of breath and chest uneasiness with exertion, we will schedule him for a coronary CT angiogram.  We will premedicate him with metoprolol 100 mg prior to his stress test. I am concerned that a nuclear study might give [place] balanced ischemia and may not be as useful.  I have strongly advised him to stop smoking.  He is already scheduled to start atorvastatin on Monday.  We will recheck his lipids in 2 months.  I would have a very low threshold to start him on a PCSK9 inhibitor.  I will see him  again in 3 months for follow-up visit, sooner if he has worsening symptoms.  2.  Hyperlipidemia: He is scheduled to start atorvastatin in several days.  His coronary calcium score is 1022.  I would have a low threshold to start PCSK9 inhibitor.  Medication Adjustments/Labs and Tests Ordered: Current medicines are reviewed at length with the patient today.  Concerns regarding medicines are outlined above.  Orders Placed This Encounter  Procedures   CT CORONARY MORPH W/CTA COR W/SCORE W/CA W/CM &/OR WO/CM   Basic metabolic panel   Lipid panel   ALT   EKG 12-Lead   ECHOCARDIOGRAM COMPLETE    Meds ordered this encounter  Medications   DISCONTD: metoprolol tartrate (LOPRESSOR) 100 MG tablet    Sig: Take 1 tablet (100 mg total) by mouth 2 (two) times daily.    Dispense:  180 tablet    Refill:  3   metoprolol tartrate (LOPRESSOR) 100 MG tablet    Sig: Take 2 hours prior to Cardiac CT    Dispense:  1 tablet    Refill:  0      Patient Instructions  Medication Instructions:  Your physician recommends that you continue on your current medications as directed. Please refer to the Current Medication list given to you today.  *If you need a refill on your cardiac medications before your next appointment, please call your pharmacy*   Lab Work: TODAY: BMP IN 2 MONTHS: Fasting Lipid Panel , ALT  If you have labs (blood work) drawn today and your tests are completely normal, you will receive your results only by: MyChart Message (if you have MyChart) OR A paper copy in the mail If you have any lab test that is abnormal or we need to change your treatment, we will call you to review the results.   Testing/Procedures: Your physician has requested that you have an echocardiogram. Echocardiography is a painless test that uses sound waves to create images of your heart. It provides your doctor with information about the size and shape of your heart and how well your heart's  chambers and valves are working. This procedure takes approximately one hour. There are no restrictions for this procedure.  Your physician has requested that you have cardiac CT. Cardiac computed tomography (CT) is a painless test that uses an x-ray machine to take clear, detailed pictures of your heart.  Please follow instruction sheet as given.     Follow-Up: At [facility], you and your health needs are our priority.  As part of our continuing mission to provide you with exceptional heart care, we have created designated Provider Care Teams.  These Care Teams include your primary Cardiologist (physician) and Advanced Practice Providers (APPs -  Physician Assistants and Nurse Practitioners) who all work together to provide you with the care you need, when you need it.   Your next appointment:   3 month(s)  The format for your next appointment:   In Person  Provider:  Mertie Moores, MD      Other Instructions    [facility] [address] [place], [place] [address] [phone]   At [facility], please arrive at the [facility] main entrance (entrance A) of [facility] 30 minutes prior to test start time. You can use the FREE valet parking offered at the main entrance (encouraged to control the heart rate for the test) Proceed to the [facility] Radiology Department (first floor) to check-in and test prep.   Please follow these instructions carefully (unless otherwise directed):  Hold all erectile dysfunction medications at least 3 days (72 hrs) prior to test.  On the Night Before the Test: Be sure to Drink plenty of water. Do not consume any caffeinated/decaffeinated beverages or chocolate 12  hours prior to your test. Do not take any antihistamines 12 hours prior to your test.   On the Day of the Test: Drink plenty of water until 1 hour prior to the test. Do not eat any food 4 hours prior to the test. You may take your regular  medications prior to the test.  Take metoprolol (Lopressor) 100 mg two hours prior to test.        After the Test: Drink plenty of water. After receiving IV contrast, you may experience a mild flushed feeling. This is normal. On occasion, you may experience a mild rash up to 24 hours after the test. This is not dangerous. If this occurs, you can take Benadryl 25 mg and increase your fluid intake. If you experience trouble breathing, this can be serious. If it is severe call 911 IMMEDIATELY. If it is mild, please call our office. If you take any of these medications: Glipizide/Metformin, Avandament, Glucavance, please do not take 48 hours after completing test unless otherwise instructed.  Please allow 2-4 weeks for scheduling of routine cardiac CTs. Some insurance companies require a pre-authorization which may delay scheduling of this test.   For non-scheduling related questions, please contact the cardiac imaging nurse navigator should you have any questions/concerns: Marchia Bond, Cardiac Imaging Nurse Navigator Gordy Clement, Cardiac Imaging Nurse Navigator [facility] Direct Office Dial: [phone]   For scheduling needs, including cancellations and rescheduling, please call [place], [phone].     Signed, Mertie Moores, MD  01/04/2021 5:23 PM    [place]

## 2021-01-05 LAB — BASIC METABOLIC PANEL
BUN/Creatinine Ratio: 16 (ref 10–24)
BUN: 12 mg/dL (ref 8–27)
CO2: 25 mmol/L (ref 20–29)
Calcium: 9.6 mg/dL (ref 8.6–10.2)
Chloride: 101 mmol/L (ref 96–106)
Creatinine, Ser: 0.73 mg/dL — ABNORMAL LOW (ref 0.76–1.27)
Glucose: 89 mg/dL (ref 70–99)
Potassium: 4.6 mmol/L (ref 3.5–5.2)
Sodium: 139 mmol/L (ref 134–144)
eGFR: 103 mL/min/{1.73_m2} (ref >59)

## 2021-01-07 ENCOUNTER — Telehealth: Payer: Self-pay | Admitting: Cardiovascular Disease

## 2021-01-07 NOTE — Telephone Encounter (Signed)
The patient has been notified of the result and verbalized understanding.  All questions (if any) were answered. Nuala Alpha, LPN 95/09/4415 12:78 AM

## 2021-01-07 NOTE — Telephone Encounter (Signed)
-----   Message from Thayer Headings, MD sent at 01/06/2021 11:48 AM EST ----- BMP is stable

## 2021-01-07 NOTE — Telephone Encounter (Signed)
Pt is returning call for Lab results

## 2021-01-17 ENCOUNTER — Telehealth (HOSPITAL_COMMUNITY): Payer: Self-pay | Admitting: Emergency Medicine

## 2021-01-17 NOTE — Telephone Encounter (Signed)
Reaching out to patient to offer assistance regarding upcoming cardiac imaging study; pt verbalizes understanding of appt date/time, parking situation and where to check in, pre-test NPO status and medications ordered, and verified current allergies; name and call back number provided for further questions should they arise Marchia Bond RN Navigator Cardiac Imaging [facility] [phone] office [phone] cell  100mg  metoprolol  Denies iv issues Arrival time 930

## 2021-01-17 NOTE — Telephone Encounter (Signed)
Attempted to call patient regarding upcoming cardiac CT appointment. °Left message on voicemail with name and callback number °Ange Puskas RN Navigator Cardiac Imaging °[facility] °[phone] Office °[phone] Cell ° °

## 2021-01-18 ENCOUNTER — Other Ambulatory Visit: Payer: Self-pay | Admitting: Cardiology

## 2021-01-18 ENCOUNTER — Ambulatory Visit (HOSPITAL_COMMUNITY)
Admission: RE | Admit: 2021-01-18 | Discharge: 2021-01-18 | Disposition: A | Discharge status: Home / Self Care | Payer: 59 | Source: Ambulatory Visit | Attending: Cardiology | Admitting: Cardiology

## 2021-01-18 ENCOUNTER — Other Ambulatory Visit: Payer: Self-pay

## 2021-01-18 ENCOUNTER — Ambulatory Visit (HOSPITAL_COMMUNITY)
Admission: RE | Admit: 2021-01-18 | Discharge: 2021-01-18 | Disposition: A | Discharge status: Home / Self Care | Payer: 59 | Source: Ambulatory Visit | Attending: Cardiovascular Disease | Admitting: Cardiovascular Disease

## 2021-01-18 DIAGNOSIS — R931 Abnormal findings on diagnostic imaging of heart and coronary circulation: Secondary | ICD-10-CM | POA: Diagnosis present

## 2021-01-18 DIAGNOSIS — R0789 Other chest pain: Secondary | ICD-10-CM

## 2021-01-18 DIAGNOSIS — R0609 Other forms of dyspnea: Secondary | ICD-10-CM | POA: Insufficient documentation

## 2021-01-18 DIAGNOSIS — I700 Atherosclerosis of aorta: Secondary | ICD-10-CM | POA: Diagnosis not present

## 2021-01-18 DIAGNOSIS — I200 Unstable angina: Secondary | ICD-10-CM | POA: Insufficient documentation

## 2021-01-18 MED ORDER — NITROGLYCERIN 0.4 MG SL SUBL
SUBLINGUAL_TABLET | SUBLINGUAL | Status: AC
  Filled 2021-01-18: qty 2

## 2021-01-18 MED ORDER — NITROGLYCERIN 0.4 MG SL SUBL
0.8000 mg | SUBLINGUAL_TABLET | SUBLINGUAL | Status: DC | PRN
  Administered 2021-01-18: 0.8 mg via SUBLINGUAL

## 2021-01-18 MED ORDER — IOHEXOL 350 MG/ML SOLN
95.0000 mL | Freq: Once | INTRAVENOUS | Status: AC | PRN
  Administered 2021-01-18: 95 mL via INTRAVENOUS

## 2021-01-20 ENCOUNTER — Encounter: Payer: Self-pay | Admitting: Cardiovascular Disease

## 2021-01-20 DIAGNOSIS — R931 Abnormal findings on diagnostic imaging of heart and coronary circulation: Secondary | ICD-10-CM

## 2021-01-21 NOTE — Telephone Encounter (Signed)
I called and spoke with the patient.  Reviewed results and recommendations.  He said he thinks his daughter checked and saw the results on MyChart.  He reports that he took Crestor several years ago and it made him sick every morning.  I adv to continue atorvastatin for now and scheduled him in [facility] on Feb 13, 2021.   Pt asked if any way he could be seen in Lipid Clinic this Wed while he is here for echo.  I've advised him it is unlikely at this time as their schedule is full but I will check and we will call him if so.

## 2021-01-21 NOTE — Telephone Encounter (Signed)
-----   Message from Thayer Headings, MD sent at 01/19/2021  8:23 PM EST ----- Coronary calcium score of 929. This was 38 percentile for age and sex matched control. LM - mild calcified plaque LAD - mild mod disease in prox LAD LCX - moderate disease RCA - large, dominant, moderate prox stenosis FFR reveals none of these moderate stenosis are flow limiting   He just started on Atorvastatin several weeks ago  for an LDL of 153. His goal LDL is around 50 I dont think he will be able to achieve an LDL of 50 with atorvastatin Please DC atorvastin Start Rosuvastatin 20 mg a day Check lipids, ALT in 2 months. I would have a low threshold to refer him to our lipid clinic for considerationi of a PCSK9 inhibitor or Inclisiran injections

## 2021-01-22 NOTE — Telephone Encounter (Signed)
Left message for patient to call back or check mychart and write back as to whether he can meet w PharmD tomorrow after echo. (3:00 pm)

## 2021-01-23 ENCOUNTER — Ambulatory Visit: Payer: 59

## 2021-01-23 ENCOUNTER — Other Ambulatory Visit: Payer: Self-pay

## 2021-01-23 ENCOUNTER — Ambulatory Visit (HOSPITAL_COMMUNITY): Payer: 59 | Attending: Cardiology

## 2021-01-23 DIAGNOSIS — I422 Other hypertrophic cardiomyopathy: Secondary | ICD-10-CM

## 2021-01-23 DIAGNOSIS — R0609 Other forms of dyspnea: Secondary | ICD-10-CM | POA: Diagnosis not present

## 2021-01-23 LAB — ECHOCARDIOGRAM COMPLETE
Area-P 1/2: 3.53 cm2
S' Lateral: 3.6 cm

## 2021-01-23 NOTE — Telephone Encounter (Signed)
Pt canceled PharmD appt, states he wants to stay on statin longer first. Agree with this since atorvastatin was recently started and pt has follow up labs scheduled in February. Can assess need for additional lipid lowering therapy at that time.

## 2021-01-23 NOTE — Progress Notes (Deleted)
Patient ID: Jonathon Dixon                 DOB: April 20, 1960                    MRN: [id number]     HPI: Jonathon Dixon is a 60 y.o. male patient referred to lipid clinic by Dr Acie Fredrickson. PMH is significant for HLD, elevated calcium score of 1022 on 12/21/20 (95th percentile for age and gender matched controls) with 3 vessel CAD, and tobacco use. He saw Dr Acie Fredrickson 01/04/21 and reported progressive SOB with exertion. He underwent another coronary CTA 01/18/21 which was similar and showed coronary calcium score of 929 (94th percentile) and 3 vessel CAD. Sent for FFR analysis which did not show flow limiting stenoses.  Echo at 2 Needs to quit smoking How old were dad and brother when had mi? Had pt been taking anything for his cholesterol?? No meds when labs checked? What was issue with rosuva? Needs atorva 80 and zetia 10 first, recheck lipids in 5 weeks, then add pcsk9i if needed  Current Medications: atorvastatin 40mg  daily Intolerances: rosuvastatin - made him sick Risk Factors: premature CAD, FHx CAD LDL goal: 55mg /dL  Diet:   Exercise:   Family History: Father had MI, CABG. Brother had MI.  Social History: Smokes > 1 PPD, denies alcohol and drug use.  Labs: 10/09/20: LDL particle # 2018, LDL 153, HDL 32, TG 124, TC 208, HDL 20.5, LP-IR 75  Past Medical History:  Diagnosis Date   Chronic back pain    Chronic low back pain    Tobacco use     Current Outpatient Medications on File Prior to Visit  Medication Sig Dispense Refill   atorvastatin (LIPITOR) 40 MG tablet Take 40 mg by mouth daily.     HYDROcodone-acetaminophen (NORCO) 10-325 MG tablet Take 1 tablet by mouth every 6 (six) hours as needed.     ibuprofen (ADVIL) 400 MG tablet Take 400 mg by mouth every 6 (six) hours as needed.     metoprolol tartrate (LOPRESSOR) 100 MG tablet Take 2 hours prior to Cardiac CT 1 tablet 0   naproxen sodium (ALEVE) 220 MG tablet Take 220 mg by mouth 2 (two) times daily.     OXYCODONE HCL PO  Take 10 mg by mouth 4 (four) times daily.      SYMBICORT 160-4.5 MCG/ACT inhaler 2 puffs as needed.     traZODone (DESYREL) 150 MG tablet Take 0.5 mg by mouth as needed.     varenicline (CHANTIX PAK) 0.5 MG X 11 & 1 MG X 42 tablet Take by mouth as directed.     No current facility-administered medications on file prior to visit.    Allergies  Allergen Reactions   Crestor [Rosuvastatin]     MYALGIA     Assessment/Plan:  1. Hyperlipidemia -

## 2021-02-13 ENCOUNTER — Ambulatory Visit: Payer: 59

## 2021-02-27 ENCOUNTER — Ambulatory Visit: Payer: 59

## 2021-03-06 ENCOUNTER — Ambulatory Visit: Payer: 59 | Admitting: Pharmacist

## 2021-03-06 ENCOUNTER — Other Ambulatory Visit: Payer: Self-pay

## 2021-03-06 DIAGNOSIS — E782 Mixed hyperlipidemia: Secondary | ICD-10-CM | POA: Diagnosis not present

## 2021-03-06 DIAGNOSIS — E785 Hyperlipidemia, unspecified: Secondary | ICD-10-CM | POA: Diagnosis not present

## 2021-03-06 NOTE — Patient Instructions (Signed)
Please come for fasting labs on 2/20  We will plan to start either Repatha or Praluent if needed Please continue atorvastatin 40mg  daily  Call me at [phone] with any questions

## 2021-03-06 NOTE — Progress Notes (Signed)
Patient ID: CHOZEN LATULIPPE                 DOB: 21-Nov-1960                    MRN: [id number]     HPI: [place] is a 61 y.o. male patient referred to lipid clinic by Nahser. PMH is significant for HLD and tobacco abuse. He had a coronary calcium score of 1022 at [facility] ( [facility] )   This is 95th percentile for age and gender matched controls.  He had three-vessel coronary artery calcifications. He was seen by Dr. Acie Fredrickson on 01/04/21 for DOE. Was started on atorvastatin. He had a CTA done which showed a CAC of 929. He then had FFR which showed no significant stenosis, no PCI targets.  Patient presents to lipid clinic today. His PCP had already prescribed him atorvastatin. He was confused which one to take. He ran out for a few weeks. Has been taking since 1/11 consistently. He has improved his diet greatly. Stopped drinking sweet tea, stopped eating milkshakes, stopped all sweets except for special events, drinking more water. Lost at least 15lb per his report. Feels a lot better. Is using chantix to help him quite smoking. Still struggling with craving when he drinks coffee and after he eats. Down to 5 per day from 30.   Current Medications: atorvastatin 40mg  daily Intolerances: rosuvastatin (myalgias- per pt it tore his stomach up) Risk Factors:  CAC LDL goal: <70  Diet: stopped drinking sweet tea, stopped eating milkshakes, stopped all sweets except for special events, drinking more water  Exercise: very active job  Family History: Fam. Hx - father had MI, CABG, Brother had MI   Social History: smoking 5 cigarette per day, down from 30 per day using chantix  Labs: 10/10/20 LDL-P 2018, LDL-C 153, HDL-C 32, TG 124 TC 208, small LDL-P 897, FLP-IR score 75 (no medications)  Past Medical History:  Diagnosis Date   Chronic back pain    Chronic low back pain    Tobacco use     Current Outpatient Medications on File Prior to Visit  Medication Sig Dispense Refill    atorvastatin (LIPITOR) 40 MG tablet Take 40 mg by mouth daily.     HYDROcodone-acetaminophen (NORCO) 10-325 MG tablet Take 1 tablet by mouth every 6 (six) hours as needed.     ibuprofen (ADVIL) 400 MG tablet Take 400 mg by mouth every 6 (six) hours as needed.     metoprolol tartrate (LOPRESSOR) 100 MG tablet Take 2 hours prior to Cardiac CT 1 tablet 0   naproxen sodium (ALEVE) 220 MG tablet Take 220 mg by mouth 2 (two) times daily.     OXYCODONE HCL PO Take 10 mg by mouth 4 (four) times daily.      SYMBICORT 160-4.5 MCG/ACT inhaler 2 puffs as needed.     traZODone (DESYREL) 150 MG tablet Take 0.5 mg by mouth as needed.     varenicline (CHANTIX PAK) 0.5 MG X 11 & 1 MG X 42 tablet Take by mouth as directed.     No current facility-administered medications on file prior to visit.    Allergies  Allergen Reactions   Crestor [Rosuvastatin]     MYALGIA     Assessment/Plan:  1. Hyperlipidemia - LDL-C is above goal of <70. Apo B should be < 80. He has made great improvements in diet. Working on quitting smoking. I  congratulated him on his achievements. We discussed the results of his advanced lipid panel and what it all means. He is doing well on atorvastatin. We talked about how we anticipate the need for additional medication. Discussed PCSK9i. Reviewed injection technique and cost. Will check NMR, apo B, LFT on 2/20. If above goal, will submit PA for PCSK9i.    Thank you,   Ramond Dial, Pharm.D, BCPS, CPP [facility]  [address] [place], [place] [address]  Phone: [phone]; Fax: [phone]

## 2021-03-08 ENCOUNTER — Other Ambulatory Visit: Payer: 59

## 2021-03-18 ENCOUNTER — Other Ambulatory Visit: Payer: 59

## 2021-03-25 ENCOUNTER — Other Ambulatory Visit: Payer: Self-pay

## 2021-03-25 ENCOUNTER — Other Ambulatory Visit: Payer: 59 | Admitting: *Deleted

## 2021-03-25 DIAGNOSIS — E782 Mixed hyperlipidemia: Secondary | ICD-10-CM

## 2021-03-26 ENCOUNTER — Encounter: Payer: Self-pay | Admitting: Cardiovascular Disease

## 2021-03-26 LAB — NMR, LIPOPROFILE
Cholesterol, Total: 103 mg/dL (ref 100–199)
HDL Particle Number: 26.8 umol/L — ABNORMAL LOW (ref >=30.5)
HDL-C: 39 mg/dL — ABNORMAL LOW (ref >39)
LDL Particle Number: 699 nmol/L (ref <1000)
LDL Size: 20.8 nm (ref >20.5)
LDL-C (NIH Calc): 49 mg/dL (ref 0–99)
LP-IR Score: 54 — ABNORMAL HIGH (ref <=45)
Small LDL Particle Number: 360 nmol/L (ref <=527)
Triglycerides: 68 mg/dL (ref 0–149)

## 2021-03-26 LAB — APOLIPOPROTEIN B: Apolipoprotein B: 56 mg/dL (ref <90)

## 2021-03-26 LAB — ALT: ALT: 32 IU/L (ref 0–44)

## 2021-03-26 NOTE — Telephone Encounter (Signed)
MyChart message sent regarding elevated LP-IR score

## 2021-04-07 ENCOUNTER — Encounter: Payer: Self-pay | Admitting: Cardiovascular Disease

## 2021-04-07 NOTE — Progress Notes (Signed)
?Cardiology Office Note:   ? ?Date:  04/08/2021  ? ?ID:  Jonathon Dixon, DOB 03-Oct-1960, MRN [id number] ? ?PCP:  Eber Hong, FNP ?  ?[facility] Providers ?Cardiologist:  Saphia Vanderford ? ? ?Referring MD: Blythe Stanford., MD  ? ?Chief Complaint  ?Patient presents with  ? Hyperlipidemia  ? ? ?Dec. 2, 2022  ? ?Jonathon Dixon is a 61 y.o. male with a hx of hyperlipidemia and cigarette smoking.  We are asked to see him today by Dr. Avel Peace for further evaluation and management of his hyperlipidemia. ? ? ?Jonathon Dixon is seen today for the first time.  Seen with Seth Bake, daughter. ? ? He brought some lab data with him including an LDL particle number of 2018.  His LDL cholesterol is 153.  Triglyceride levels 124.  Total cholesterol is 208.  His HDL is 20.5.  His insulin resistance score is 75 with a normal being less than 45. ? ?Has DOE climbing stairs,  ?No regular exercise   ? ?He had a coronary calcium score of 1022 at [facility] ( [facility] )   This is 95th percentile for age and gender matched controls.  He had three-vessel coronary artery calcifications. ? ?Smokes > 1 ppd  ? ?Fam. Hx - father had MI, CABG, ?Brother had MI  ? ?He eats an unrestricted diet. ?He has a milkshake every night. ? ?Has not had  covid .  ? ? 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 ?April 08, 2021 ? ?Seen with Juliann Pulse, wife ? ?Jonathon Dixon is seen for follow up of his CP.   Strong family hx of CAD  ? ?Coronary CTA -  ?RCA - prox 50% stenosis ?LM - mild plaque ?LAD - mild - mod plaque ?LCx - small, non dominant,  scattered calcified plaque in prox and mid vessel.   OM1 has a 50-70% stenosis  ?Small PFO ?Wt is 201 - was 215  ?Still smoking 10 cigarettes a day  ? ?Complains of some dizziness when he looks up  ?He may need to see   neuro ?He wants to discuss with his niece Lora Havens, NP )  ?Would have a low threshold to refer to neuro for this issue  ? ? ?Past Medical  History:  ?Diagnosis Date  ? Chronic back pain   ? Chronic low back pain   ? Tobacco use   ? ? ?Past Surgical History:  ?Procedure Laterality Date  ? BACK SURGERY  1994  ? 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 HERNIA REPAIR    ? ? ?Current Medications: ?Current Meds  ?Medication Sig  ? atorvastatin (LIPITOR) 40 MG tablet Take 40 mg by mouth daily.  ? HYDROcodone-acetaminophen (NORCO) 10-325 MG tablet Take 1 tablet by mouth every 6 (six) hours as needed.  ? ibuprofen (ADVIL) 400 MG tablet Take 400 mg by mouth every 6 (six) hours as needed.  ? metoprolol tartrate (LOPRESSOR) 100 MG tablet Take 2 hours prior to Cardiac CT  ? naproxen sodium (ALEVE) 220 MG tablet Take 220 mg by mouth 2 (two) times daily.  ? traZODone (DESYREL) 150 MG tablet Take 0.5 mg by mouth as needed.  ?  varenicline (CHANTIX) 1 MG tablet Take 1 mg by mouth 2 (two) times daily.  ? [DISCONTINUED] varenicline (CHANTIX PAK) 0.5 MG X 11 & 1 MG X 42 tablet Take by mouth as directed.  ?  ? ?Allergies:   Crestor [rosuvastatin]  ? ?Social History  ? ?Socioeconomic History  ? Marital status: Married  ?  Spouse name: Not on file  ? Number of children: Not on file  ? Years of education: Not on file  ? Highest education level: Not on file  ?Occupational History  ? Not on file  ?Tobacco Use  ? Smoking status: Every Day  ?  Types: Cigarettes  ? Smokeless tobacco: Never  ?Substance and Sexual Activity  ? Alcohol use: No  ? Drug use: No  ? Sexual activity: Not on file  ?Other Topics Concern  ? Not on file  ?Social History Narrative  ? Not on file  ? ?Social Determinants of Health  ? ?Financial Resource Strain: Not on file  ?Food Insecurity: Not on file  ?Transportation Needs: Not on file  ?Physical Activity: Not on file  ?Stress: Not on file   ?Social Connections: Not on file  ?  ? ?Family History: ?The patient's family history includes Heart attack in his brother and father. ? ?ROS:   ?Please see the history of present illness.    ? All other systems reviewed and are negative. ? ?EKGs/Labs/Other Studies Reviewed:   ? ?The following studies were reviewed today: ? ? ?EKG:   December 2, normal sinus rhythm at 71.  No ST or T wave changes. ? ?Recent Labs: ?01/04/2021: BUN 12; Creatinine, Ser 0.73; Potassium 4.6; Sodium 139 ?03/25/2021: ALT 32  ?Recent Lipid Panel ?No results found for: CHOL, TRIG, HDL, CHOLHDL, VLDL, LDLCALC, LDLDIRECT ? ? ?Risk Assessment/Calculations:   ?  ? ?    ? ?Physical Exam:   ? ? ?Physical Exam: ?Blood pressure 118/72, pulse 64, height '6\' 1"'$  (1.854 m), weight 201 lb (91.2 kg), SpO2 98 %. ? ?GEN:  Well nourished, well developed in no acute distress ?HEENT: Normal ?NECK: No JVD; No carotid bruits ?LYMPHATICS: No lymphadenopathy ?CARDIAC: RRR  , no murmurs  ?RESPIRATORY:  Clear to auscultation without rales, wheezing or rhonchi  ?ABDOMEN: Soft, non-tender, non-distended ?MUSCULOSKELETAL:  No edema; No deformity  ?SKIN: Warm and dry ?NEUROLOGIC:  Alert and oriented x 3 ? ? ?ASSESSMENT:   ? ?No diagnosis found. ? ?PLAN:   ? ? ? ?Possible unstable angina:   coronary CT angio showed mild - mod CAD.  No obstructive lesions.   Cont with lipid lowering efforts.  Refill atorvastatin  ?Has lost weight  ?Needs to incorporate cardio exercise into his weekly regimine - 3 times a week ?Needs to stop smoking  ? ?2.  Hyperlipidemia: cont atorva. ?NMR lipid profile looks great - insulin resistance score is a bit elevated.  ? ?3.  Dizziness :  occurs when he is looking up.   We discussed a referral to neuro.   He wants to discuss with his family.   I would have a low threshold to refer him to neuro .  ? ?Overall, he has made great progress.  ?   ? ?  ? ? ?Medication Adjustments/Labs and Tests Ordered: ?Current medicines are reviewed at length with the  patient today.  Concerns regarding medicines are outlined above.  ?No orders of the defined types were placed in this encounter. ? ? ?No orders of the defined types were placed in this encounter. ? ? ? ? ?  There are no Patient Instructions on file for this visit.  ? ?Signed, ?Mertie Moores, MD  ?04/08/2021 8:27 AM    ?[place] ? ?

## 2021-04-08 ENCOUNTER — Ambulatory Visit: Payer: 59 | Admitting: Cardiovascular Disease

## 2021-04-08 ENCOUNTER — Encounter: Payer: Self-pay | Admitting: Cardiovascular Disease

## 2021-04-08 ENCOUNTER — Other Ambulatory Visit: Payer: Self-pay

## 2021-04-08 VITALS — BP 118/72 | HR 64 | Ht 73.0 in | Wt 201.0 lb

## 2021-04-08 DIAGNOSIS — E782 Mixed hyperlipidemia: Secondary | ICD-10-CM | POA: Diagnosis not present

## 2021-04-08 DIAGNOSIS — I2510 Atherosclerotic heart disease of native coronary artery without angina pectoris: Secondary | ICD-10-CM

## 2021-04-08 MED ORDER — ATORVASTATIN CALCIUM 40 MG PO TABS: 40.0000 mg | ORAL_TABLET | Freq: Every day | ORAL | 3 refills | Status: AC

## 2021-04-08 NOTE — Patient Instructions (Signed)
Medication Instructions:  ?Your physician recommends that you continue on your current medications as directed. Please refer to the Current Medication list given to you today. ? ?*If you need a refill on your cardiac medications before your next appointment, please call your pharmacy* ? ? ?Lab Work: ?NONE ?If you have labs (blood work) drawn today and your tests are completely normal, you will receive your results only by: ?MyChart Message (if you have MyChart) OR ?A paper copy in the mail ?If you have any lab test that is abnormal or we need to change your treatment, we will call you to review the results. ? ? ?Testing/Procedures: ?NONE ? ? ?Follow-Up: ?At [facility], you and your health needs are our priority.  As part of our continuing mission to provide you with exceptional heart care, we have created designated Provider Care Teams.  These Care Teams include your primary Cardiologist (physician) and Advanced Practice Providers (APPs -  Physician Assistants and Nurse Practitioners) who all work together to provide you with the care you need, when you need it. ? ?Your next appointment:   ?1 year(s) ? ?The format for your next appointment:   ?In Person ? ?Provider:   ?Mertie Moores, MD or [address] PA-C, Ermalinda Barrios, PA-C, or Richardson Dopp, [place] ?

## 2021-09-04 DIAGNOSIS — G894 Chronic pain syndrome: Secondary | ICD-10-CM | POA: Diagnosis not present

## 2021-09-04 DIAGNOSIS — M5489 Other dorsalgia: Secondary | ICD-10-CM | POA: Diagnosis not present

## 2021-12-02 DIAGNOSIS — G894 Chronic pain syndrome: Secondary | ICD-10-CM | POA: Diagnosis not present

## 2021-12-02 DIAGNOSIS — M47816 Spondylosis without myelopathy or radiculopathy, lumbar region: Secondary | ICD-10-CM | POA: Diagnosis not present

## 2021-12-02 DIAGNOSIS — M419 Scoliosis, unspecified: Secondary | ICD-10-CM | POA: Diagnosis not present

## 2021-12-02 DIAGNOSIS — M47817 Spondylosis without myelopathy or radiculopathy, lumbosacral region: Secondary | ICD-10-CM | POA: Diagnosis not present

## 2021-12-02 DIAGNOSIS — M4186 Other forms of scoliosis, lumbar region: Secondary | ICD-10-CM | POA: Diagnosis not present

## 2021-12-02 DIAGNOSIS — M5489 Other dorsalgia: Secondary | ICD-10-CM | POA: Diagnosis not present

## 2021-12-02 DIAGNOSIS — M545 Low back pain, unspecified: Secondary | ICD-10-CM | POA: Diagnosis not present

## 2022-02-13 DIAGNOSIS — L570 Actinic keratosis: Secondary | ICD-10-CM | POA: Diagnosis not present

## 2022-02-13 DIAGNOSIS — D692 Other nonthrombocytopenic purpura: Secondary | ICD-10-CM | POA: Diagnosis not present

## 2022-02-13 DIAGNOSIS — L814 Other melanin hyperpigmentation: Secondary | ICD-10-CM | POA: Diagnosis not present

## 2022-02-13 DIAGNOSIS — Z859 Personal history of malignant neoplasm, unspecified: Secondary | ICD-10-CM | POA: Diagnosis not present

## 2022-03-05 DIAGNOSIS — M5451 Vertebrogenic low back pain: Secondary | ICD-10-CM | POA: Diagnosis not present

## 2022-03-05 DIAGNOSIS — G894 Chronic pain syndrome: Secondary | ICD-10-CM | POA: Diagnosis not present

## 2022-04-07 ENCOUNTER — Encounter: Payer: Self-pay | Admitting: Cardiovascular Disease

## 2022-04-07 NOTE — Progress Notes (Unsigned)
Cardiology Office Note:    Date:  04/09/2022   ID:  Jonathon Dixon, DOB 1960/09/02, MRN [id number]  PCP:  Jonathon Hong, FNP   [facility] Providers Cardiologist:  Jonathon Dixon   Referring MD: Jonathon Hong, FNP   Chief Complaint  Patient presents with   Hyperlipidemia    Dec. 2, 2022   Jonathon Dixon is a 62 y.o. male with a hx of hyperlipidemia and cigarette smoking.  We are asked to see him today by Dr. Avel Dixon for further evaluation and management of his hyperlipidemia.   Jonathon Dixon is seen today for the first time.  Seen with Jonathon Dixon, daughter.   He brought some lab data with him including an LDL particle number of 2018.  His LDL cholesterol is 153.  Triglyceride levels 124.  Total cholesterol is 208.  His HDL is 20.5.  His insulin resistance score is 75 with a normal being less than 45.  Has DOE climbing stairs,  No regular exercise    He had a coronary calcium score of 1022 at [place] [facility] ( [facility] )   This is 95th percentile for age and gender matched controls.  He had three-vessel coronary artery calcifications.  Smokes > 1 ppd   Fam. Hx - father had MI, CABG, Brother had MI   He eats an unrestricted diet. He has a milkshake every night.  Has not had  covid .    Has a house in [place], goes to the doctor down there  Has a family [place] to retire soon   April 08, 2021  Seen with Jonathon Dixon, wife  Jonathon Dixon is seen for follow up of his CP.   Strong family hx of CAD   Coronary CTA -  RCA - prox 50% stenosis LM - mild plaque LAD - mild - mod plaque LCx - small, non dominant,  scattered calcified plaque in prox and mid vessel.   OM1 has a 50-70% stenosis  Small PFO Wt is 201 - was 215  Still smoking 10 cigarettes a day   Complains of some dizziness when he looks up  He may need to see   neuro He wants to discuss with his niece Jonathon Havens, NP )  Would have a low threshold to refer to neuro for this issue    April 08, 2022 Peng  is seen for follow up for his CAD   Coronary CTA -  RCA - prox 50% stenosis LM - mild plaque LAD - mild - mod plaque LCx - small, non dominant,  scattered calcified plaque in prox and mid vessel.   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   HYDROcodone-acetaminophen (NORCO) 10-325 MG tablet Take 1 tablet by mouth every 6 (six) hours as needed.   naproxen sodium (ALEVE) 220 MG tablet Take 220 mg by mouth 2 (two) times daily.   traZODone (DESYREL) 150 MG tablet Take 0.5 mg by mouth as needed.   [DISCONTINUED] metoprolol tartrate (LOPRESSOR) 100 MG tablet Take 2 hours prior to Cardiac CT     Allergies:   Crestor [rosuvastatin]   Social History   Socioeconomic History   Marital status: Married    Spouse name: Not on file   Number of children: Not on file   Years of education: Not on file   Highest education level: Not on file  Occupational History   Not on file  Tobacco Use   Smoking status: Every Day    Types: Cigarettes   Smokeless tobacco: Never  Substance and Sexual Activity   Alcohol use: No   Drug use: No   Sexual activity: Not on file  Other Topics Concern   Not on file  Social History Narrative   Not on file   Social Determinants of Health   Financial Resource  Strain: Not on file  Food Insecurity: Not on file  Transportation Needs: Not on file  Physical Activity: Not on file  Stress: Not on file  Social Connections: Not on file     Family History: The patient's family history includes Heart attack in his brother and father.  ROS:   Please see the history of present illness.     All other systems reviewed and are negative.  EKGs/Labs/Other Studies Reviewed:    The following studies were reviewed today:   EKG:    April 08, 2022  .  NSR at 66.   ECG    Recent Labs: 04/08/2022: ALT 35; BUN 12; Creatinine, Ser 0.70; Potassium 4.5; Sodium 139  Recent Lipid Panel    Component Value Date/Time   CHOL 108 04/08/2022 1159   TRIG 78 04/08/2022 1159   HDL 36 (L) 04/08/2022 1159   CHOLHDL 3.0 04/08/2022 1159   LDLCALC 56 04/08/2022 1159     Risk Assessment/Calculations:           Physical Exam:     Physical Exam: Blood pressure 112/74, Dixon 66, height '6\' 1"'$  (1.854 m), weight 210 lb 9.6 oz (95.5 kg), SpO2 94 %.       GEN:  Well nourished, well developed in no acute distress HEENT: Normal NECK: No JVD; No carotid bruits LYMPHATICS: No lymphadenopathy CARDIAC: RRR , no murmurs, rubs, gallops RESPIRATORY:  Clear to auscultation without rales, wheezing or rhonchi  ABDOMEN: Soft, non-tender, non-distended MUSCULOSKELETAL:  No edema; No deformity  SKIN: Warm and dry NEUROLOGIC:  Alert and oriented x 3    ASSESSMENT:    1. Coronary artery disease involving native coronary artery of native heart without angina pectoris   2. Mixed hyperlipidemia     PLAN:      Possible unstable angina:   Coronary CTA -  RCA - prox 50% stenosis LM - mild plaque LAD - mild - mod plaque LCx - small, non dominant,  scattered calcified plaque in prox and mid vessel.   OM1 has a 50-70% stenosis   No evidence of obstructive CAD .   Continue lipid management. I've advised him to stop smokiing    2.  Hyperlipidemia: labs look great.   Continue Atorvastatin  Medication Adjustments/Labs and Tests Ordered: Current medicines are reviewed at length with the patient today.  Concerns regarding medicines are outlined above.  Orders Placed This Encounter  Procedures   Lipid panel   ALT   Basic metabolic panel   EKG [id number]    No orders of the defined types were placed in this encounter.     Patient Instructions  Medication Instructions:  Your physician recommends that you continue on your current medications as directed. Please refer to the Current Medication list given to you today.  *If you need a refill on your cardiac medications before your next appointment, please call your pharmacy*   Lab Work: Lipid, ALT, BMET If you have labs (blood work) drawn today and your tests are completely normal, you will receive your results only by: [place] (if you have MyChart) OR A paper copy in the mail If you have any lab test that is abnormal or we need to change your treatment, we will call you to review the results.   Testing/Procedures: None    Follow-Up: At [facility], you and your health needs are our priority.  As part of our continuing mission to provide you with exceptional heart care, we have created designated Provider Care Teams.  These Care Teams include your primary Cardiologist (physician) and Advanced Practice Providers (APPs -  Physician Assistants and Nurse Practitioners) who all work together to provide you with the care you need, when you need it.     Your next appointment:   1 year(s)  Provider:   Mertie Moores, MD     Signed, Mertie Moores, MD  04/09/2022 7:42 AM    [place]

## 2022-04-08 ENCOUNTER — Ambulatory Visit: Payer: BC Managed Care – PPO | Attending: Cardiovascular Disease | Admitting: Cardiovascular Disease

## 2022-04-08 ENCOUNTER — Encounter: Payer: Self-pay | Admitting: Cardiovascular Disease

## 2022-04-08 VITALS — BP 112/74 | HR 66 | Ht 73.0 in | Wt 210.6 lb

## 2022-04-08 DIAGNOSIS — I2510 Atherosclerotic heart disease of native coronary artery without angina pectoris: Secondary | ICD-10-CM | POA: Diagnosis not present

## 2022-04-08 DIAGNOSIS — E782 Mixed hyperlipidemia: Secondary | ICD-10-CM | POA: Diagnosis not present

## 2022-04-08 NOTE — Patient Instructions (Signed)
Medication Instructions:  Your physician recommends that you continue on your current medications as directed. Please refer to the Current Medication list given to you today.  *If you need a refill on your cardiac medications before your next appointment, please call your pharmacy*   Lab Work: Lipid, ALT, BMET If you have labs (blood work) drawn today and your tests are completely normal, you will receive your results only by: [place] (if you have MyChart) OR A paper copy in the mail If you have any lab test that is abnormal or we need to change your treatment, we will call you to review the results.   Testing/Procedures: None    Follow-Up: At [facility], you and your health needs are our priority.  As part of our continuing mission to provide you with exceptional heart care, we have created designated Provider Care Teams.  These Care Teams include your primary Cardiologist (physician) and Advanced Practice Providers (APPs -  Physician Assistants and Nurse Practitioners) who all work together to provide you with the care you need, when you need it.     Your next appointment:   1 year(s)  Provider:   Mertie Moores, MD

## 2022-04-09 DIAGNOSIS — M5126 Other intervertebral disc displacement, lumbar region: Secondary | ICD-10-CM | POA: Diagnosis not present

## 2022-04-09 DIAGNOSIS — M48061 Spinal stenosis, lumbar region without neurogenic claudication: Secondary | ICD-10-CM | POA: Diagnosis not present

## 2022-04-09 DIAGNOSIS — M47816 Spondylosis without myelopathy or radiculopathy, lumbar region: Secondary | ICD-10-CM | POA: Diagnosis not present

## 2022-04-09 LAB — BASIC METABOLIC PANEL
BUN/Creatinine Ratio: 17 (ref 10–24)
BUN: 12 mg/dL (ref 8–27)
CO2: 23 mmol/L (ref 20–29)
Calcium: 9.6 mg/dL (ref 8.6–10.2)
Chloride: 102 mmol/L (ref 96–106)
Creatinine, Ser: 0.7 mg/dL — ABNORMAL LOW (ref 0.76–1.27)
Glucose: 97 mg/dL (ref 70–99)
Potassium: 4.5 mmol/L (ref 3.5–5.2)
Sodium: 139 mmol/L (ref 134–144)
eGFR: 104 mL/min/{1.73_m2} (ref >59)

## 2022-04-09 LAB — ALT: ALT: 35 IU/L (ref 0–44)

## 2022-04-09 LAB — LIPID PANEL
Chol/HDL Ratio: 3 ratio (ref 0.0–5.0)
Cholesterol, Total: 108 mg/dL (ref 100–199)
HDL: 36 mg/dL — ABNORMAL LOW (ref >39)
LDL Chol Calc (NIH): 56 mg/dL (ref 0–99)
Triglycerides: 78 mg/dL (ref 0–149)
VLDL Cholesterol Cal: 16 mg/dL (ref 5–40)

## 2022-05-07 DIAGNOSIS — Z79899 Other long term (current) drug therapy: Secondary | ICD-10-CM | POA: Diagnosis not present

## 2022-05-07 DIAGNOSIS — G894 Chronic pain syndrome: Secondary | ICD-10-CM | POA: Diagnosis not present

## 2022-05-07 DIAGNOSIS — M5451 Vertebrogenic low back pain: Secondary | ICD-10-CM | POA: Diagnosis not present

## 2023-04-10 ENCOUNTER — Ambulatory Visit: Payer: BC Managed Care – PPO | Admitting: Cardiovascular Disease

## 2023-04-27 ENCOUNTER — Encounter: Payer: Self-pay | Admitting: Cardiovascular Disease

## 2023-04-27 NOTE — Progress Notes (Unsigned)
 Cardiology Office Note:    Date:  05/01/2023   ID:  Jonathon Dixon, DOB [birth year], MRN [id number]  PCP:  Jonathon Query, FNP   [facility] Providers Cardiologist:  Jonathon Dixon   Referring MD: Jonathon Query, FNP   Chief Complaint  Patient presents with   Coronary Artery Disease        Hyperlipidemia    Dec. 2, 2022   Jonathon Dixon is a 63 y.o. male with a hx of hyperlipidemia and cigarette smoking.  We are asked to see him today by Dr. Fabienne Dixon for further evaluation and management of his hyperlipidemia.   Jonathon Dixon is seen today for the first time.  Seen with Jonathon Dixon, daughter.   He brought some lab data with him including an LDL particle number of 2018.  His LDL cholesterol is 153.  Triglyceride levels 124.  Total cholesterol is 208.  His HDL is 20.5.  His insulin resistance score is 75 with a normal being less than 45.  Has DOE climbing stairs,  No regular exercise    He had a coronary calcium score of 1022 at [facility] ( [facility] )   This is 95th percentile for age and gender matched controls.  He had three-vessel coronary artery calcifications.  Smokes > 1 ppd   Fam. Hx - father had MI, CABG, Brother had MI   He eats an unrestricted diet. He has a milkshake every night.  Has not had  covid .    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   Strong family hx of CAD   Coronary CTA -  RCA - prox 50% stenosis LM - mild plaque LAD - mild - mod plaque LCx - small, non dominant,  scattered calcified plaque in prox and mid vessel.   OM1 has a 50-70% stenosis  Small PFO Wt is 201 - was 215  Still smoking 10 cigarettes a day   Complains of some dizziness when he looks up  He may need to see   neuro He wants to discuss with his niece Jonathon Ochoa, NP )  Would have a low threshold to refer to neuro for  this issue    April 08, 2022 Jonathon Dixon is seen for follow up for his CAD   Coronary CTA -  RCA - prox 50% stenosis LM - mild plaque LAD - mild - mod plaque LCx - small, non dominant,  scattered calcified plaque in prox and mid vessel.   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   HYDROcodone-acetaminophen (NORCO) 10-325 MG tablet Take 1 tablet by mouth every 6 (six) hours as needed.   naproxen sodium (ALEVE) 220 MG tablet Take 220 mg by mouth 2 (two) times daily.   traZODone (DESYREL) 150 MG tablet Take 0.5 mg by mouth as needed.     Allergies:   Crestor [rosuvastatin]   Social History   Socioeconomic History   Marital status: Married    Spouse name: Not on file   Number of children: Not on file   Years of education: Not on file   Highest education level: Not on file  Occupational History   Not on file  Tobacco Use   Smoking status: Every Day    Types: Cigarettes   Smokeless tobacco: Never  Substance and Sexual Activity   Alcohol use: No   Drug use: No   Sexual activity: Not  on file  Other Topics Concern   Not on file  Social History Narrative   Not on file   Social Drivers of Health   Financial Resource Strain: Not on file  Food Insecurity: Not on file  Transportation Needs: Not on file  Physical Activity: Not on file  Stress: Not on file  Social Connections: Unknown (06/17/2021)   Received from [facility]   Social Network    Social Network: Not on file     Family History: The patient's family history includes Heart attack in his brother and father.  ROS:   Please see the history of present illness.     All other systems reviewed and are negative.  EKGs/Labs/Other Studies Reviewed:    The following studies were reviewed today:   EKG Interpretation Date/Time:  Friday May 01 2023 10:53:44 EDT Ventricular Rate:  78 PR Interval:  176 QRS Duration:  92 QT Interval:  358 QTC Calculation: 408 R Axis:   -47  Text Interpretation: Normal sinus rhythm Pulmonary disease pattern Left anterior fascicular block No previous ECGs available Confirmed by Jonathon Dixon ([id number]) on 05/01/2023 11:28:22 AM      Recent Labs: No results found for requested labs within last 365 days.  Recent Lipid Panel    Component Value Date/Time   CHOL 108 04/08/2022 1159   TRIG 78 04/08/2022 1159   HDL 36 (L) 04/08/2022 1159   CHOLHDL 3.0 04/08/2022 1159   LDLCALC 56 04/08/2022 1159     Risk Assessment/Calculations:           Physical Exam:      Physical Exam: Blood pressure 134/76, pulse 78, height 6\' 1"  (1.854 m), weight 220 lb (99.8 kg), SpO2 97%.       GEN:  Well nourished, well developed in no acute distress HEENT: Normal NECK: No JVD; No carotid bruits LYMPHATICS: No lymphadenopathy CARDIAC: RRR , no murmurs, rubs, gallops RESPIRATORY:  slightly reduced breath sounds .  No wheezes   ABDOMEN: Soft, non-tender, non-distended MUSCULOSKELETAL:  No edema; No deformity  SKIN: Warm and dry NEUROLOGIC:  Alert and oriented x  3     ASSESSMENT:    1. Coronary artery  disease involving native coronary artery of native heart without angina pectoris   2. Mixed hyperlipidemia      PLAN:      Possible unstable angina:   Coronary CTA -  RCA - prox 50% stenosis LM - mild plaque LAD - mild - mod plaque LCx - small, non dominant,  scattered calcified plaque in prox and mid vessel.   OM1 has a 50-70% stenosis   No evidence of obstructive CAD .   Continue lipid management.  No angina     2.  Hyperlipidemia:  LDL is at goal Cont meds        Medication Adjustments/Labs and Tests Ordered: Current medicines are reviewed at length with the patient today.  Concerns regarding medicines are outlined above.  Orders Placed This Encounter  Procedures   ALT   Basic metabolic panel with GFR   Lipid panel   EKG 12-Lead    No orders of the defined types were placed in this encounter.     Patient Instructions  Lab Work: Lipids, ALT, BMET today If you have labs (blood work) drawn today and your tests are completely normal, you will receive your results only by: MyChart Message (if you have MyChart) OR A paper copy in the mail If you have any lab test that is abnormal or we need to change your treatment, we will call you to review the results.  Follow-Up: At [facility], you and your health needs are our priority.  As part of our continuing mission to provide you with exceptional heart care, our providers are all part of one team.  This team includes your primary Cardiologist (physician) and Advanced Practice Providers or APPs (Physician Assistants and Nurse Practitioners) who all work together to provide you with the care you need, when you need it.  Your next appointment:   1 year(s)  Provider:   Kristeen Miss, MD      1st Floor: - Lobby - Registration  - Pharmacy  - Lab - Cafe  2nd Floor: - PV Lab - Diagnostic Testing (echo, CT, nuclear med)  3rd Floor: - Vacant  4th  Floor: - TCTS (cardiothoracic surgery) - AFib Clinic - Structural Heart Clinic - Vascular Surgery  - Vascular Ultrasound  5th Floor: - HeartCare Cardiology (general and EP) - Clinical Pharmacy for coumadin, hypertension, lipid, weight-loss medications, and med management appointments    Valet parking services will be available as well.     Signed, Jonathon Miss, MD  05/01/2023 11:28 AM    [facility]

## 2023-05-01 ENCOUNTER — Encounter: Payer: Self-pay | Admitting: Cardiovascular Disease

## 2023-05-01 ENCOUNTER — Ambulatory Visit: Attending: Internal Medicine | Admitting: Cardiovascular Disease

## 2023-05-01 VITALS — BP 134/76 | HR 78 | Ht 73.0 in | Wt 220.0 lb

## 2023-05-01 DIAGNOSIS — I2510 Atherosclerotic heart disease of native coronary artery without angina pectoris: Secondary | ICD-10-CM | POA: Diagnosis not present

## 2023-05-01 DIAGNOSIS — E782 Mixed hyperlipidemia: Secondary | ICD-10-CM

## 2023-05-01 NOTE — Patient Instructions (Signed)
 Lab Work: Lipids, ALT, BMET today If you have labs (blood work) drawn today and your tests are completely normal, you will receive your results only by: MyChart Message (if you have MyChart) OR A paper copy in the mail If you have any lab test that is abnormal or we need to change your treatment, we will call you to review the results.  Follow-Up: At [facility], you and your health needs are our priority.  As part of our continuing mission to provide you with exceptional heart care, our providers are all part of one team.  This team includes your primary Cardiologist (physician) and Advanced Practice Providers or APPs (Physician Assistants and Nurse Practitioners) who all work together to provide you with the care you need, when you need it.  Your next appointment:   1 year(s)  Provider:   Kristeen Miss, MD      1st Floor: - Lobby - Registration  - Pharmacy  - Lab - Cafe  2nd Floor: - PV Lab - Diagnostic Testing (echo, CT, nuclear med)  3rd Floor: - Vacant  4th Floor: - TCTS (cardiothoracic surgery) - AFib Clinic - Structural Heart Clinic - Vascular Surgery  - Vascular Ultrasound  5th Floor: - HeartCare Cardiology (general and EP) - Clinical Pharmacy for coumadin, hypertension, lipid, weight-loss medications, and med management appointments    Valet parking services will be available as well.

## 2023-05-02 LAB — BASIC METABOLIC PANEL WITH GFR
BUN/Creatinine Ratio: 24 (ref 10–24)
BUN: 16 mg/dL (ref 8–27)
CO2: 24 mmol/L (ref 20–29)
Calcium: 9.1 mg/dL (ref 8.6–10.2)
Chloride: 103 mmol/L (ref 96–106)
Creatinine, Ser: 0.67 mg/dL — ABNORMAL LOW (ref 0.76–1.27)
Glucose: 79 mg/dL (ref 70–99)
Potassium: 4.6 mmol/L (ref 3.5–5.2)
Sodium: 140 mmol/L (ref 134–144)
eGFR: 104 mL/min/{1.73_m2} (ref >59)

## 2023-05-02 LAB — ALT: ALT: 41 IU/L (ref 0–44)

## 2023-05-02 LAB — LIPID PANEL
Chol/HDL Ratio: 4.1 ratio (ref 0.0–5.0)
Cholesterol, Total: 140 mg/dL (ref 100–199)
HDL: 34 mg/dL — ABNORMAL LOW (ref >39)
LDL Chol Calc (NIH): 81 mg/dL (ref 0–99)
Triglycerides: 139 mg/dL (ref 0–149)
VLDL Cholesterol Cal: 25 mg/dL (ref 5–40)

## 2023-05-03 ENCOUNTER — Encounter: Payer: Self-pay | Admitting: Cardiovascular Disease

## 2023-05-11 ENCOUNTER — Telehealth: Payer: Self-pay

## 2023-05-11 DIAGNOSIS — E782 Mixed hyperlipidemia: Secondary | ICD-10-CM

## 2023-05-11 DIAGNOSIS — Z79899 Other long term (current) drug therapy: Secondary | ICD-10-CM

## 2023-05-11 MED ORDER — EZETIMIBE 10 MG PO TABS: 10.0000 mg | ORAL_TABLET | Freq: Every day | ORAL | 3 refills | Status: AC

## 2023-05-11 NOTE — Telephone Encounter (Signed)
 Called patient at secondary number on file and he picked up. He agrees to the addition of zetia and repeat labs in several months. Medication sent to pharmacy requested. Labs entered and released for him to have drawn.

## 2023-05-11 NOTE — Telephone Encounter (Signed)
-----   Message from Kristeen Miss sent at 05/03/2023  7:42 AM EDT ----- Pt has mild CAD His LDL goal is < 70 Current LDL is 81 Continue atorvastatin 40 mg a day , Add zetia 10 mg a day  Continue healthy diet, exercise  Check lipids , ALT , BMP in 4 months
